# Patient Record
Sex: Female | Born: 1935 | Race: White | Hispanic: No | Smoking: Never smoker
Health system: Southern US, Community
[De-identification: ages and names within clinical notes are randomized; demographics above are authoritative.]

## PROBLEM LIST (undated history)

## (undated) DIAGNOSIS — I10 Essential (primary) hypertension: Secondary | ICD-10-CM

## (undated) DIAGNOSIS — E7800 Pure hypercholesterolemia, unspecified: Secondary | ICD-10-CM

## (undated) DIAGNOSIS — E079 Disorder of thyroid, unspecified: Secondary | ICD-10-CM

## (undated) HISTORY — PX: TUBAL LIGATION: SHX77

## (undated) HISTORY — PX: CHOLECYSTECTOMY: SHX55

## (undated) HISTORY — PX: HEMORRHOID SURGERY: SHX153

---

## 1999-09-19 ENCOUNTER — Emergency Department (HOSPITAL_COMMUNITY): Admission: EM | Admit: 1999-09-19 | Discharge: 1999-09-19 | Payer: Self-pay | Admitting: Emergency Medicine

## 1999-09-19 ENCOUNTER — Encounter: Payer: Self-pay | Admitting: Emergency Medicine

## 1999-10-03 ENCOUNTER — Encounter: Payer: Self-pay | Admitting: General Surgery

## 1999-10-07 ENCOUNTER — Ambulatory Visit (HOSPITAL_COMMUNITY): Admission: RE | Admit: 1999-10-07 | Discharge: 1999-10-08 | Payer: Self-pay | Admitting: General Surgery

## 2000-03-16 ENCOUNTER — Encounter: Payer: Self-pay | Admitting: Family Medicine

## 2000-03-16 ENCOUNTER — Encounter: Admission: RE | Admit: 2000-03-16 | Discharge: 2000-03-16 | Payer: Self-pay | Admitting: Family Medicine

## 2017-02-23 ENCOUNTER — Other Ambulatory Visit: Payer: Self-pay | Admitting: Physician Assistant

## 2017-02-23 ENCOUNTER — Ambulatory Visit
Admission: RE | Admit: 2017-02-23 | Discharge: 2017-02-23 | Disposition: A | Discharge status: Home / Self Care | Payer: 59 | Source: Ambulatory Visit | Attending: Physician Assistant | Admitting: Physician Assistant

## 2017-02-23 DIAGNOSIS — M25531 Pain in right wrist: Secondary | ICD-10-CM

## 2017-09-06 ENCOUNTER — Ambulatory Visit
Admission: RE | Admit: 2017-09-06 | Discharge: 2017-09-06 | Disposition: A | Discharge status: Home / Self Care | Payer: Medicare Other | Source: Ambulatory Visit | Attending: Physician Assistant | Admitting: Physician Assistant

## 2017-09-06 ENCOUNTER — Other Ambulatory Visit: Payer: Self-pay | Admitting: Physician Assistant

## 2017-09-06 DIAGNOSIS — R062 Wheezing: Secondary | ICD-10-CM

## 2017-09-06 DIAGNOSIS — R059 Cough, unspecified: Secondary | ICD-10-CM

## 2017-09-06 DIAGNOSIS — R05 Cough: Secondary | ICD-10-CM

## 2021-08-24 ENCOUNTER — Emergency Department (HOSPITAL_BASED_OUTPATIENT_CLINIC_OR_DEPARTMENT_OTHER)
Admission: EM | Admit: 2021-08-24 | Discharge: 2021-08-25 | Disposition: A | Discharge status: Home / Self Care | Payer: Medicare Other | Attending: Emergency Medicine | Admitting: Emergency Medicine

## 2021-08-24 ENCOUNTER — Emergency Department (HOSPITAL_BASED_OUTPATIENT_CLINIC_OR_DEPARTMENT_OTHER): Payer: Medicare Other

## 2021-08-24 ENCOUNTER — Encounter (HOSPITAL_BASED_OUTPATIENT_CLINIC_OR_DEPARTMENT_OTHER): Payer: Self-pay | Admitting: Emergency Medicine

## 2021-08-24 ENCOUNTER — Other Ambulatory Visit: Payer: Self-pay

## 2021-08-24 DIAGNOSIS — D649 Anemia, unspecified: Secondary | ICD-10-CM | POA: Insufficient documentation

## 2021-08-24 DIAGNOSIS — R7989 Other specified abnormal findings of blood chemistry: Secondary | ICD-10-CM | POA: Insufficient documentation

## 2021-08-24 DIAGNOSIS — R600 Localized edema: Secondary | ICD-10-CM | POA: Insufficient documentation

## 2021-08-24 DIAGNOSIS — R030 Elevated blood-pressure reading, without diagnosis of hypertension: Secondary | ICD-10-CM

## 2021-08-24 DIAGNOSIS — Z79899 Other long term (current) drug therapy: Secondary | ICD-10-CM | POA: Diagnosis not present

## 2021-08-24 DIAGNOSIS — R001 Bradycardia, unspecified: Secondary | ICD-10-CM | POA: Insufficient documentation

## 2021-08-24 DIAGNOSIS — I10 Essential (primary) hypertension: Secondary | ICD-10-CM | POA: Insufficient documentation

## 2021-08-24 DIAGNOSIS — R609 Edema, unspecified: Secondary | ICD-10-CM

## 2021-08-24 HISTORY — DX: Disorder of thyroid, unspecified: E07.9

## 2021-08-24 HISTORY — DX: Pure hypercholesterolemia, unspecified: E78.00

## 2021-08-24 HISTORY — DX: Essential (primary) hypertension: I10

## 2021-08-24 LAB — CBC WITH DIFFERENTIAL/PLATELET
Abs Immature Granulocytes: 0.03 10*3/uL (ref 0.00–0.07)
Basophils Absolute: 0 10*3/uL (ref 0.0–0.1)
Basophils Relative: 1 %
Eosinophils Absolute: 0.1 10*3/uL (ref 0.0–0.5)
Eosinophils Relative: 2 %
HCT: 33.4 % — ABNORMAL LOW (ref 36.0–46.0)
Hemoglobin: 11.1 g/dL — ABNORMAL LOW (ref 12.0–15.0)
Immature Granulocytes: 0 %
Lymphocytes Relative: 21 %
Lymphs Abs: 1.6 10*3/uL (ref 0.7–4.0)
MCH: 28.8 pg (ref 26.0–34.0)
MCHC: 33.2 g/dL (ref 30.0–36.0)
MCV: 86.5 fL (ref 80.0–100.0)
Monocytes Absolute: 0.6 10*3/uL (ref 0.1–1.0)
Monocytes Relative: 8 %
Neutro Abs: 5.1 10*3/uL (ref 1.7–7.7)
Neutrophils Relative %: 68 %
Platelets: 206 10*3/uL (ref 150–400)
RBC: 3.86 MIL/uL — ABNORMAL LOW (ref 3.87–5.11)
RDW: 13.2 % (ref 11.5–15.5)
WBC: 7.5 10*3/uL (ref 4.0–10.5)
nRBC: 0 % (ref 0.0–0.2)

## 2021-08-24 LAB — COMPREHENSIVE METABOLIC PANEL
ALT: 13 U/L (ref 0–44)
AST: 19 U/L (ref 15–41)
Albumin: 3.8 g/dL (ref 3.5–5.0)
Alkaline Phosphatase: 54 U/L (ref 38–126)
Anion gap: 8 (ref 5–15)
BUN: 26 mg/dL — ABNORMAL HIGH (ref 8–23)
CO2: 26 mmol/L (ref 22–32)
Calcium: 9.3 mg/dL (ref 8.9–10.3)
Chloride: 97 mmol/L — ABNORMAL LOW (ref 98–111)
Creatinine, Ser: 0.99 mg/dL (ref 0.44–1.00)
GFR, Estimated: 53 mL/min — ABNORMAL LOW (ref >60)
Glucose, Bld: 112 mg/dL — ABNORMAL HIGH (ref 70–99)
Potassium: 3.9 mmol/L (ref 3.5–5.1)
Sodium: 131 mmol/L — ABNORMAL LOW (ref 135–145)
Total Bilirubin: 0.6 mg/dL (ref 0.3–1.2)
Total Protein: 7.1 g/dL (ref 6.5–8.1)

## 2021-08-24 LAB — BRAIN NATRIURETIC PEPTIDE: B Natriuretic Peptide: 120.2 pg/mL — ABNORMAL HIGH (ref 0.0–100.0)

## 2021-08-24 MED ORDER — HYDRALAZINE HCL 10 MG PO TABS
10.0000 mg | ORAL_TABLET | Freq: Once | ORAL | Status: AC
  Administered 2021-08-24: 10 mg via ORAL
  Filled 2021-08-24: qty 1

## 2021-08-24 NOTE — ED Triage Notes (Signed)
States she checked her BP at home at 190/80. She takes blood pressure medication. Denies chest pain, headache, SOB ?

## 2021-08-24 NOTE — ED Provider Notes (Signed)
?[facility] EMERGENCY DEPARTMENT ?Provider Note ? ? ?CSN: [id number] ?Arrival date & time: 08/24/21  2046 ? ?  ? ?History ? ?Chief Complaint  ?Patient presents with  ? Hypertension  ? ? ?Erika Nguyen is a 88 y.o. female. ? ?The history is provided by the patient and a relative. No language interpreter was used.  ?Hypertension ?This is a chronic problem. The problem occurs constantly. The problem has been rapidly worsening. Pertinent negatives include no chest pain, no abdominal pain, no headaches and no shortness of breath. Nothing aggravates the symptoms. Nothing relieves the symptoms. She has tried nothing for the symptoms. The treatment provided no relief.  ? ?  ? ?Home Medications ?Prior to Admission medications   ?Not on File  ?   ? ?Allergies    ?Patient has no known allergies.   ? ?Review of Systems   ?Review of Systems  ?Constitutional:  Negative for chills, diaphoresis, fatigue and fever.  ?HENT:  Negative for congestion.   ?Eyes:  Negative for visual disturbance.  ?Respiratory:  Negative for shortness of breath.   ?Cardiovascular:  Positive for leg swelling. Negative for chest pain and palpitations.  ?Gastrointestinal:  Negative for abdominal pain, constipation, diarrhea, nausea and vomiting.  ?Genitourinary:  Negative for dysuria, flank pain and frequency.  ?Musculoskeletal:  Negative for back pain, neck pain and neck stiffness.  ?Skin:  Negative for rash and wound.  ?Neurological:  Negative for dizziness, weakness, light-headedness, numbness and headaches.  ?Psychiatric/Behavioral:  Negative for agitation and confusion.   ? ?Physical Exam ?Updated Vital Signs ?BP (!) 183/68 (BP Location: Right Arm)   Pulse 74   Temp 98.5 ?F (36.9 ?C) (Oral)   Resp 16   Ht 4\' 11"  (1.499 m)   Wt 52.2 kg   SpO2 100%   BMI 23.23 kg/m?  ?Physical Exam ?Vitals and nursing note reviewed.  ?Constitutional:   ?   General: She is not in acute distress. ?   Appearance: She is well-developed. She is not ill-appearing,  toxic-appearing or diaphoretic.  ?HENT:  ?   Head: Normocephalic and atraumatic.  ?   Nose: No congestion or rhinorrhea.  ?   Mouth/Throat:  ?   Mouth: Mucous membranes are moist.  ?   Pharynx: No oropharyngeal exudate or posterior oropharyngeal erythema.  ?Eyes:  ?   Extraocular Movements: Extraocular movements intact.  ?   Conjunctiva/sclera: Conjunctivae normal.  ?   Pupils: Pupils are equal, round, and reactive to light.  ?Cardiovascular:  ?   Rate and Rhythm: Normal rate and regular rhythm.  ?   Pulses: Normal pulses.  ?   Heart sounds: No murmur heard. ?Pulmonary:  ?   Effort: Pulmonary effort is normal. No respiratory distress.  ?   Breath sounds: Normal breath sounds. No wheezing, rhonchi or rales.  ?Chest:  ?   Chest wall: No tenderness.  ?Abdominal:  ?   General: Abdomen is flat.  ?   Palpations: Abdomen is soft.  ?   Tenderness: There is no abdominal tenderness. There is no right CVA tenderness, left CVA tenderness, guarding or rebound.  ?Musculoskeletal:     ?   General: No swelling or tenderness.  ?   Cervical back: Neck supple. No tenderness.  ?   Right lower leg: Edema present.  ?   Left lower leg: Edema present.  ?Skin: ?   General: Skin is warm and dry.  ?   Capillary Refill: Capillary refill takes less than 2 seconds.  ?  Findings: No erythema or rash.  ?Neurological:  ?   General: No focal deficit present.  ?   Mental Status: She is alert.  ?   Sensory: No sensory deficit.  ?   Motor: No weakness.  ?Psychiatric:     ?   Mood and Affect: Mood normal.  ? ? ?ED Results / Procedures / Treatments   ?Labs ?(all labs ordered are listed, but only abnormal results are displayed) ?Labs Reviewed  ?CBC WITH DIFFERENTIAL/PLATELET - Abnormal; Notable for the following components:  ?    Result Value  ? RBC 3.86 (*)   ? Hemoglobin 11.1 (*)   ? HCT 33.4 (*)   ? All other components within normal limits  ?COMPREHENSIVE METABOLIC PANEL - Abnormal; Notable for the following components:  ? Sodium 131 (*)   ? Chloride  97 (*)   ? Glucose, Bld 112 (*)   ? BUN 26 (*)   ? GFR, Estimated 53 (*)   ? All other components within normal limits  ?BRAIN NATRIURETIC PEPTIDE - Abnormal; Notable for the following components:  ? B Natriuretic Peptide 120.2 (*)   ? All other components within normal limits  ?URINE CULTURE  ?URINALYSIS, ROUTINE W REFLEX MICROSCOPIC  ?TSH  ? ? ?EKG ?EKG Interpretation ? ?Date/Time:  Sunday August 24 2021 22:25:58 EDT ?Ventricular Rate:  65 ?PR Interval:  155 ?QRS Duration: 102 ?QT Interval:  409 ?QTC Calculation: 426 ?R Axis:   14 ?Text Interpretation: Sinus rhythm Abnormal R-wave progression, early transition when compared to prior, similar appearance. No STEMI Confirmed by Theda Belfastegeler, Chris ([id number]) on 08/24/2021 10:49:09 PM ? ?Radiology ?DG Chest 2 View ? ?Result Date: 08/24/2021 ?CLINICAL DATA:  Peripheral edema. EXAM: CHEST - 2 VIEW COMPARISON:  September 06, 2017 FINDINGS: Chronic appearing diffusely increased interstitial lung markings are seen, without evidence of acute infiltrate, pleural effusion or pneumothorax. The heart size and mediastinal contours are within normal limits. There is marked severity calcification and tortuosity of the thoracic aorta. Degenerative changes seen throughout the thoracic spine. IMPRESSION: Chronic appearing increased interstitial lung markings without evidence of acute or active cardiopulmonary disease. Electronically Signed   By: Aram Candelahaddeus  Houston M.D.   On: 08/24/2021 22:26   ? ?Procedures ?Procedures  ? ? ?Medications Ordered in ED ?Medications  ?hydrALAZINE (APRESOLINE) tablet 10 mg (10 mg Oral Given 08/24/21 2212)  ? ? ?ED Course/ Medical Decision Making/ A&P ?  ?                        ?Medical Decision Making ?Amount and/or Complexity of Data Reviewed ?Labs: ordered. ?Radiology: ordered. ? ?Risk ?Prescription drug management. ? ? ? ?Erika Nguyen is a 88 y.o. female with a past medical history significant for hypertension, hypercholesterolemia, thyroid disease, and previous  cholecystectomy who presents with elevated blood pressures and worsening peripheral edema.  According to family, patient has been doing well and avoiding salt and taking her medications however today noticed blood pressures were going up into the 170s, 180s, and 190s systolic.  Patient has not had any acute symptoms however with no chest pain, shortness of breath, fatigue, lightheadedness, chest pain, shortness of breath, palpitations or syncope.  Family does report of left several days they have noticed more edema in both of her legs but she is not having any other peripheral edema.  She denies any recent infection symptoms with no urinary changes, constipation, diarrhea, or significant cough.  She is otherwise been feeling at her baseline.  No focal neurologic deficits reported..  Family was concerned due to the blood pressure being elevated and the edema they noticed on her when they were assessing her today. ? ?On exam, lungs are clear and chest is nontender.  Abdomen is nontender.  No focal neurologic deficits.  Patient is pleasant and has no complaints.  Patient does have some pitting edema in both legs near the ankles but otherwise has intact sensation and strength and pulses in extremities.  No focal neurologic deficits.  Patient well-appearing.  On my initial exam, blood pressure was in the 190s. ? ?Family reports her blood pressure is normally in the 140s for the patient that this is elevated for her.  Due to the edema being new, it is reasonable to get some screening blood work to look for new kidney dysfunction, new heart failure, or other acute abnormalities.  We will also look for occult infection leading to the labile blood pressure and get urinalysis, chest x-ray, and labs. ? ?If work-up is reassuring, anticipate discharge home to speak to her PCP tomorrow to discuss blood pressure medication titration.  Due to the elevation into the 190s, we will give 1 dose of oral hydralazine to try and lower it  slightly however we will hold on beta-blockers as her heart rate is borderline bradycardic at this time. ? ?Anticipate discharge with PCP follow-up if work-up is reassuring. ? ?Work-up began to return.  BNP slight

## 2021-08-25 LAB — URINALYSIS, MICROSCOPIC (REFLEX)

## 2021-08-25 LAB — URINALYSIS, ROUTINE W REFLEX MICROSCOPIC
Bilirubin Urine: NEGATIVE
Glucose, UA: NEGATIVE mg/dL
Ketones, ur: NEGATIVE mg/dL
Nitrite: NEGATIVE
Protein, ur: NEGATIVE mg/dL
Specific Gravity, Urine: 1.01 (ref 1.005–1.030)
pH: 6 (ref 5.0–8.0)

## 2021-08-25 LAB — TSH: TSH: 1.34 u[IU]/mL (ref 0.350–4.500)

## 2021-08-25 MED ORDER — FOSFOMYCIN TROMETHAMINE 3 G PO PACK
3.0000 g | PACK | Freq: Once | ORAL | Status: AC
  Administered 2021-08-25: 3 g via ORAL
  Filled 2021-08-25: qty 3

## 2021-08-25 NOTE — ED Provider Notes (Signed)
Nursing notes and vitals signs, including pulse oximetry, reviewed. ? ?Summary of this visit's results, reviewed by myself: ? ?EKG: ? EKG Interpretation ? ?Date/Time:  Sunday August 24 2021 22:25:58 EDT ?Ventricular Rate:  65 ?PR Interval:  155 ?QRS Duration: 102 ?QT Interval:  409 ?QTC Calculation: 426 ?R Axis:   14 ?Text Interpretation: Sinus rhythm Abnormal R-wave progression, early transition when compared to prior, similar appearance. No STEMI Confirmed by Antony Blackbird [phone]) on 08/24/2021 10:49:09 PM ?  ? ?  ? ? ?Labs:  ?Results for orders placed or performed during the hospital encounter of 08/24/21 (from the past 24 hour(s))  ?CBC with Differential     Status: Abnormal  ? Collection Time: 08/24/21 10:20 PM  ?Result Value Ref Range  ? WBC 7.5 4.0 - 10.5 K/uL  ? RBC 3.86 (L) 3.87 - 5.11 MIL/uL  ? Hemoglobin 11.1 (L) 12.0 - 15.0 g/dL  ? HCT 33.4 (L) 36.0 - 46.0 %  ? MCV 86.5 80.0 - 100.0 fL  ? MCH 28.8 26.0 - 34.0 pg  ? MCHC 33.2 30.0 - 36.0 g/dL  ? RDW 13.2 11.5 - 15.5 %  ? Platelets 206 150 - 400 K/uL  ? nRBC 0.0 0.0 - 0.2 %  ? Neutrophils Relative % 68 %  ? Neutro Abs 5.1 1.7 - 7.7 K/uL  ? Lymphocytes Relative 21 %  ? Lymphs Abs 1.6 0.7 - 4.0 K/uL  ? Monocytes Relative 8 %  ? Monocytes Absolute 0.6 0.1 - 1.0 K/uL  ? Eosinophils Relative 2 %  ? Eosinophils Absolute 0.1 0.0 - 0.5 K/uL  ? Basophils Relative 1 %  ? Basophils Absolute 0.0 0.0 - 0.1 K/uL  ? Immature Granulocytes 0 %  ? Abs Immature Granulocytes 0.03 0.00 - 0.07 K/uL  ?Comprehensive metabolic panel     Status: Abnormal  ? Collection Time: 08/24/21 10:20 PM  ?Result Value Ref Range  ? Sodium 131 (L) 135 - 145 mmol/L  ? Potassium 3.9 3.5 - 5.1 mmol/L  ? Chloride 97 (L) 98 - 111 mmol/L  ? CO2 26 22 - 32 mmol/L  ? Glucose, Bld 112 (H) 70 - 99 mg/dL  ? BUN 26 (H) 8 - 23 mg/dL  ? Creatinine, Ser 0.99 0.44 - 1.00 mg/dL  ? Calcium 9.3 8.9 - 10.3 mg/dL  ? Total Protein 7.1 6.5 - 8.1 g/dL  ? Albumin 3.8 3.5 - 5.0 g/dL  ? AST 19 15 - 41 U/L  ? ALT 13 0 - 44  U/L  ? Alkaline Phosphatase 54 38 - 126 U/L  ? Total Bilirubin 0.6 0.3 - 1.2 mg/dL  ? GFR, Estimated 53 (L) >60 mL/min  ? Anion gap 8 5 - 15  ?Brain natriuretic peptide     Status: Abnormal  ? Collection Time: 08/24/21 10:20 PM  ?Result Value Ref Range  ? B Natriuretic Peptide 120.2 (H) 0.0 - 100.0 pg/mL  ?Urinalysis, Routine w reflex microscopic     Status: Abnormal  ? Collection Time: 08/24/21 11:49 PM  ?Result Value Ref Range  ? Color, Urine YELLOW YELLOW  ? APPearance CLEAR CLEAR  ? Specific Gravity, Urine 1.010 1.005 - 1.030  ? pH 6.0 5.0 - 8.0  ? Glucose, UA NEGATIVE NEGATIVE mg/dL  ? Hgb urine dipstick TRACE (A) NEGATIVE  ? Bilirubin Urine NEGATIVE NEGATIVE  ? Ketones, ur NEGATIVE NEGATIVE mg/dL  ? Protein, ur NEGATIVE NEGATIVE mg/dL  ? Nitrite NEGATIVE NEGATIVE  ? Leukocytes,Ua SMALL (A) NEGATIVE  ?Urinalysis, Microscopic (reflex)  Status: Abnormal  ? Collection Time: 08/24/21 11:49 PM  ?Result Value Ref Range  ? RBC / HPF 0-5 0 - 5 RBC/hpf  ? WBC, UA 11-20 0 - 5 WBC/hpf  ? Bacteria, UA RARE (A) NONE SEEN  ? Squamous Epithelial / LPF 0-5 0 - 5  ? WBC Clumps PRESENT   ? ? ?Imaging Studies: ?DG Chest 2 View ? ?Result Date: 08/24/2021 ?CLINICAL DATA:  Peripheral edema. EXAM: CHEST - 2 VIEW COMPARISON:  September 06, 2017 FINDINGS: Chronic appearing diffusely increased interstitial lung markings are seen, without evidence of acute infiltrate, pleural effusion or pneumothorax. The heart size and mediastinal contours are within normal limits. There is marked severity calcification and tortuosity of the thoracic aorta. Degenerative changes seen throughout the thoracic spine. IMPRESSION: Chronic appearing increased interstitial lung markings without evidence of acute or active cardiopulmonary disease. Electronically Signed   By: Virgina Norfolk M.D.   On: 08/24/2021 22:26   ? ?12:09 AM ?The patient's urinalysis is concerning for an early urinary tract infection.  We will treat with fosfomycin. ?  ?Shanon Rosser,  MD ?08/25/21 0009 ? ?

## 2021-08-26 LAB — URINE CULTURE: Culture: NO GROWTH

## 2023-03-16 ENCOUNTER — Other Ambulatory Visit: Payer: Self-pay

## 2023-03-16 ENCOUNTER — Emergency Department (HOSPITAL_COMMUNITY): Payer: Medicare Other

## 2023-03-16 ENCOUNTER — Observation Stay (HOSPITAL_COMMUNITY)
Admission: EM | Admit: 2023-03-16 | Discharge: 2023-03-18 | Disposition: A | Discharge status: Skilled Nursing Facility | Payer: Medicare Other | Attending: Internal Medicine | Admitting: Internal Medicine

## 2023-03-16 ENCOUNTER — Observation Stay (HOSPITAL_COMMUNITY): Payer: Medicare Other

## 2023-03-16 DIAGNOSIS — Z7982 Long term (current) use of aspirin: Secondary | ICD-10-CM | POA: Diagnosis not present

## 2023-03-16 DIAGNOSIS — S32000A Wedge compression fracture of unspecified lumbar vertebra, initial encounter for closed fracture: Secondary | ICD-10-CM | POA: Insufficient documentation

## 2023-03-16 DIAGNOSIS — Z9889 Other specified postprocedural states: Secondary | ICD-10-CM | POA: Diagnosis not present

## 2023-03-16 DIAGNOSIS — W19XXXA Unspecified fall, initial encounter: Secondary | ICD-10-CM | POA: Diagnosis not present

## 2023-03-16 DIAGNOSIS — E876 Hypokalemia: Secondary | ICD-10-CM | POA: Insufficient documentation

## 2023-03-16 DIAGNOSIS — I10 Essential (primary) hypertension: Secondary | ICD-10-CM | POA: Diagnosis not present

## 2023-03-16 DIAGNOSIS — Z79899 Other long term (current) drug therapy: Secondary | ICD-10-CM | POA: Diagnosis not present

## 2023-03-16 DIAGNOSIS — R930 Abnormal findings on diagnostic imaging of skull and head, not elsewhere classified: Secondary | ICD-10-CM

## 2023-03-16 DIAGNOSIS — R0602 Shortness of breath: Secondary | ICD-10-CM | POA: Insufficient documentation

## 2023-03-16 DIAGNOSIS — F0390 Unspecified dementia without behavioral disturbance: Secondary | ICD-10-CM | POA: Insufficient documentation

## 2023-03-16 DIAGNOSIS — R531 Weakness: Principal | ICD-10-CM | POA: Insufficient documentation

## 2023-03-16 DIAGNOSIS — E871 Hypo-osmolality and hyponatremia: Secondary | ICD-10-CM | POA: Insufficient documentation

## 2023-03-16 DIAGNOSIS — M25552 Pain in left hip: Secondary | ICD-10-CM | POA: Insufficient documentation

## 2023-03-16 DIAGNOSIS — E039 Hypothyroidism, unspecified: Secondary | ICD-10-CM | POA: Diagnosis not present

## 2023-03-16 DIAGNOSIS — R2243 Localized swelling, mass and lump, lower limb, bilateral: Secondary | ICD-10-CM | POA: Insufficient documentation

## 2023-03-16 DIAGNOSIS — W010XXA Fall on same level from slipping, tripping and stumbling without subsequent striking against object, initial encounter: Secondary | ICD-10-CM | POA: Diagnosis not present

## 2023-03-16 DIAGNOSIS — T796XXA Traumatic ischemia of muscle, initial encounter: Secondary | ICD-10-CM | POA: Insufficient documentation

## 2023-03-16 LAB — CBC WITH DIFFERENTIAL/PLATELET
Abs Immature Granulocytes: 0.05 10*3/uL (ref 0.00–0.07)
Basophils Absolute: 0 10*3/uL (ref 0.0–0.1)
Basophils Relative: 0 %
Eosinophils Absolute: 0.1 10*3/uL (ref 0.0–0.5)
Eosinophils Relative: 1 %
HCT: 33 % — ABNORMAL LOW (ref 36.0–46.0)
Hemoglobin: 10.7 g/dL — ABNORMAL LOW (ref 12.0–15.0)
Immature Granulocytes: 1 %
Lymphocytes Relative: 12 %
Lymphs Abs: 1 10*3/uL (ref 0.7–4.0)
MCH: 28.5 pg (ref 26.0–34.0)
MCHC: 32.4 g/dL (ref 30.0–36.0)
MCV: 88 fL (ref 80.0–100.0)
Monocytes Absolute: 0.8 10*3/uL (ref 0.1–1.0)
Monocytes Relative: 9 %
Neutro Abs: 6.3 10*3/uL (ref 1.7–7.7)
Neutrophils Relative %: 77 %
Platelets: 217 10*3/uL (ref 150–400)
RBC: 3.75 MIL/uL — ABNORMAL LOW (ref 3.87–5.11)
RDW: 12.7 % (ref 11.5–15.5)
WBC: 8.3 10*3/uL (ref 4.0–10.5)
nRBC: 0 % (ref 0.0–0.2)

## 2023-03-16 LAB — BASIC METABOLIC PANEL
Anion gap: 10 (ref 5–15)
BUN: 20 mg/dL (ref 8–23)
CO2: 25 mmol/L (ref 22–32)
Calcium: 9 mg/dL (ref 8.9–10.3)
Chloride: 95 mmol/L — ABNORMAL LOW (ref 98–111)
Creatinine, Ser: 0.84 mg/dL (ref 0.44–1.00)
GFR, Estimated: >60 mL/min (ref >60)
Glucose, Bld: 96 mg/dL (ref 70–99)
Potassium: 3.4 mmol/L — ABNORMAL LOW (ref 3.5–5.1)
Sodium: 130 mmol/L — ABNORMAL LOW (ref 135–145)

## 2023-03-16 LAB — CK: Total CK: 261 U/L — ABNORMAL HIGH (ref 38–234)

## 2023-03-16 MED ORDER — POTASSIUM CHLORIDE CRYS ER 20 MEQ PO TBCR
40.0000 meq | EXTENDED_RELEASE_TABLET | Freq: Once | ORAL | Status: AC
  Administered 2023-03-16: 40 meq via ORAL
  Filled 2023-03-16: qty 2

## 2023-03-16 MED ORDER — SODIUM CHLORIDE 0.9 % IV SOLN: 250.0000 mL | INTRAVENOUS | Status: AC | PRN

## 2023-03-16 MED ORDER — SERTRALINE HCL 25 MG PO TABS
25.0000 mg | ORAL_TABLET | Freq: Every evening | ORAL | Status: DC
  Administered 2023-03-16 – 2023-03-17 (×2): 25 mg via ORAL
  Filled 2023-03-16 (×2): qty 1

## 2023-03-16 MED ORDER — ASPIRIN 81 MG PO TBEC
81.0000 mg | DELAYED_RELEASE_TABLET | Freq: Every day | ORAL | Status: DC
  Administered 2023-03-16 – 2023-03-18 (×3): 81 mg via ORAL
  Filled 2023-03-16 (×3): qty 1

## 2023-03-16 MED ORDER — SODIUM CHLORIDE 0.9% FLUSH
3.0000 mL | Freq: Two times a day (BID) | INTRAVENOUS | Status: DC
  Administered 2023-03-16 – 2023-03-18 (×4): 3 mL via INTRAVENOUS

## 2023-03-16 MED ORDER — MORPHINE SULFATE (PF) 4 MG/ML IV SOLN
4.0000 mg | Freq: Once | INTRAVENOUS | Status: AC
  Administered 2023-03-16: 4 mg via INTRAVENOUS
  Filled 2023-03-16: qty 1

## 2023-03-16 MED ORDER — HYDRALAZINE HCL 10 MG PO TABS
10.0000 mg | ORAL_TABLET | Freq: Two times a day (BID) | ORAL | Status: DC
  Administered 2023-03-16 – 2023-03-17 (×2): 10 mg via ORAL
  Filled 2023-03-16 (×2): qty 1

## 2023-03-16 MED ORDER — LEVOTHYROXINE SODIUM 100 MCG PO TABS
100.0000 ug | ORAL_TABLET | Freq: Every day | ORAL | Status: DC
  Administered 2023-03-17 – 2023-03-18 (×2): 100 ug via ORAL
  Filled 2023-03-16 (×2): qty 1

## 2023-03-16 MED ORDER — HYDROCODONE-ACETAMINOPHEN 5-325 MG PO TABS
1.0000 | ORAL_TABLET | ORAL | Status: DC | PRN
  Administered 2023-03-16: 1 via ORAL
  Filled 2023-03-16: qty 1

## 2023-03-16 MED ORDER — BENAZEPRIL HCL 20 MG PO TABS
40.0000 mg | ORAL_TABLET | Freq: Every morning | ORAL | Status: DC
  Administered 2023-03-17 – 2023-03-18 (×2): 40 mg via ORAL
  Filled 2023-03-16 (×2): qty 2

## 2023-03-16 MED ORDER — ENOXAPARIN SODIUM 40 MG/0.4ML IJ SOSY
40.0000 mg | PREFILLED_SYRINGE | INTRAMUSCULAR | Status: DC
  Administered 2023-03-16 – 2023-03-17 (×2): 40 mg via SUBCUTANEOUS
  Filled 2023-03-16 (×2): qty 0.4

## 2023-03-16 MED ORDER — CARBONYL IRON 15 MG PO CHEW: 15.0000 mg | CHEWABLE_TABLET | Freq: Every day | ORAL | Status: DC

## 2023-03-16 MED ORDER — ACETAMINOPHEN 325 MG PO TABS: 650.0000 mg | ORAL_TABLET | Freq: Four times a day (QID) | ORAL | 0 refills | Status: AC | PRN

## 2023-03-16 MED ORDER — SODIUM CHLORIDE 0.9% FLUSH: 3.0000 mL | INTRAVENOUS | Status: DC | PRN

## 2023-03-16 MED ORDER — MORPHINE SULFATE (PF) 2 MG/ML IV SOLN: 1.0000 mg | INTRAVENOUS | Status: DC | PRN

## 2023-03-16 MED ORDER — PRAVASTATIN SODIUM 40 MG PO TABS
40.0000 mg | ORAL_TABLET | Freq: Every morning | ORAL | Status: DC
  Administered 2023-03-17 – 2023-03-18 (×2): 40 mg via ORAL
  Filled 2023-03-16 (×2): qty 1

## 2023-03-16 MED ORDER — VITAMIN B-12 1000 MCG PO TABS
2500.0000 ug | ORAL_TABLET | Freq: Every day | ORAL | Status: DC
  Administered 2023-03-16 – 2023-03-18 (×3): 2500 ug via ORAL
  Filled 2023-03-16 (×3): qty 3

## 2023-03-16 MED ORDER — ACETAMINOPHEN 650 MG RE SUPP: 650.0000 mg | Freq: Four times a day (QID) | RECTAL | Status: DC | PRN

## 2023-03-16 MED ORDER — ACETAMINOPHEN 325 MG PO TABS: 650.0000 mg | ORAL_TABLET | Freq: Four times a day (QID) | ORAL | Status: DC | PRN

## 2023-03-16 MED ORDER — ONDANSETRON HCL 4 MG/2ML IJ SOLN: 4.0000 mg | Freq: Four times a day (QID) | INTRAMUSCULAR | Status: DC | PRN

## 2023-03-16 MED ORDER — FERROUS SULFATE 325 (65 FE) MG PO TABS
325.0000 mg | ORAL_TABLET | Freq: Every day | ORAL | Status: DC
  Administered 2023-03-17 – 2023-03-18 (×2): 325 mg via ORAL
  Filled 2023-03-16 (×2): qty 1

## 2023-03-16 MED ORDER — OXYCODONE HCL 5 MG PO TABS: 2.5000 mg | ORAL_TABLET | Freq: Three times a day (TID) | ORAL | 0 refills | Status: DC | PRN

## 2023-03-16 MED ORDER — ONDANSETRON HCL 4 MG PO TABS: 4.0000 mg | ORAL_TABLET | Freq: Four times a day (QID) | ORAL | Status: DC | PRN

## 2023-03-16 NOTE — ED Triage Notes (Addendum)
Patient BIB EMS from home had a fall about 2300 last night. Tripped going to restroom. No LOC, did not head hit. No blood thinner.  C/o lower back pain. No deformities noted. 160/60, CBG 108, 70. Fent IV. Abrasion on left forehead.

## 2023-03-16 NOTE — H&P (Signed)
History and Physical    Erika Nguyen UYQ:[id number] DOB: 01-30-1927 DOA: 03/16/2023  I have briefly reviewed the patient's prior medical records in [facility]  PCP: Roger Kill, PA-C  Patient coming from: home  Chief Complaint: Ground-level fall  HPI: Erika Nguyen is a 89 y.o. female who lives independently, with medical history significant of hypertension, hyperlipidemia, hypothyroidism, who comes to the hospital after having a ground-level fall at home.  She fell in the early hours of 03/16/2023.  She was able to alert the family after spending several hours on the floor, and they came and picked her up and brought her to the hospital.  She denies any loss of consciousness, chest pain and reports that this was a mechanical fall as her legs gave out.  2 daughters are at bedside, and they tell me that over the last couple of weeks she has been having weakness in the left leg and they believe that is the cause for her fall.  They also report that she has been having intermittent confusion, attributing to probably mild dementia but more so in the last week, and on Saturday she told the family that patient's mother is trying to call her home.  There are no reported fever, chills, abdominal pain, nausea, vomiting, diarrhea.  Patient just received morphine on my evaluation and she is an unreliable historian, but does wake up and denies any specific complaints.  ED Course: In the emergency room she is afebrile, slightly hypertensive.  CT scan of the entire spine as well as head showed L5 superior endplate compression deformity, nondisplaced fracture through the lateral osteophyte at the inferior endplate of L4, as well as multilevel degenerative disc bulges with moderate to severe spinal canal narrowing L4-L2 5 and L5-S1.  There is also small meningioma which appears to be incidental.  She was treated conservatively in the ED, fitted with a TLSO brace and attempts were for the patient to go  back home however she was unable to ambulate due to left leg weakness as well as severe back pain at the L5 level.  Hospitalist team was asked to admit given inability to ambulate  Review of Systems: All systems reviewed, and apart from HPI, all negative  Past Medical History:  Diagnosis Date   High cholesterol    Hypertension    Thyroid disease     Past Surgical History:  Procedure Laterality Date   CHOLECYSTECTOMY     HEMORRHOID SURGERY     TUBAL LIGATION       reports that she has never smoked. She has never used smokeless tobacco. She reports that she does not drink alcohol. No history on file for drug use.  No Known Allergies  Family history reviewed and noncontributory  Prior to Admission medications   Medication Sig Start Date End Date Taking? Authorizing Provider  acetaminophen (TYLENOL) 325 MG tablet Take 2 tablets (650 mg total) by mouth every 6 (six) hours as needed. 03/16/23  Yes Tanda Rockers A, DO  aspirin EC 81 MG tablet Take 81 mg by mouth daily. 07/10/15  Yes [provider]  benazepril (LOTENSIN) 40 MG tablet Take 40 mg by mouth in the morning.   Yes [provider]  Carbonyl Iron 15 MG CHEW Chew 15 mg by mouth daily. 06/11/22  Yes [provider]  cholecalciferol (VITAMIN D3) 25 MCG (1000 UNIT) tablet Take 1,000 Units by mouth daily.   Yes [provider]  Cyanocobalamin 2500 MCG TABS Take 2,500 mcg  by mouth daily. 02/16/22  Yes [provider]  hydrALAZINE (APRESOLINE) 10 MG tablet Take 1 tablet by mouth 2 (two) times daily. 06/26/22  Yes [provider]  hydrochlorothiazide (HYDRODIURIL) 25 MG tablet Take 1 tablet by mouth in the morning. 04/21/22  Yes [provider]  levothyroxine (SYNTHROID) 100 MCG tablet Take 100 mcg by mouth daily before breakfast. 01/06/23 01/06/24 Yes [provider]  oxyCODONE (ROXICODONE) 5 MG immediate release tablet Take 0.5 tablets (2.5 mg total) by mouth every 8  (eight) hours as needed. 03/16/23  Yes Tanda Rockers A, DO  pravastatin (PRAVACHOL) 40 MG tablet Take 1 tablet by mouth in the morning. 06/04/22  Yes [provider]  sertraline (ZOLOFT) 25 MG tablet Take 1 tablet by mouth every evening. 02/20/22  Yes [provider]  valsartan (DIOVAN) 320 MG tablet Take 320 mg by mouth in the morning.   Yes [provider]  estradiol (ESTRACE) 0.1 MG/GM vaginal cream Place 1 Applicatorful vaginally See admin instructions. Use on Tuesdays and Thursdays 06/12/21   [provider]    Physical Exam: Vitals:   03/16/23 1230 03/16/23 1245 03/16/23 1451 03/16/23 1630  BP: (!) 157/55 (!) 157/49  (!) 165/59  Pulse: (!) 57 (!) 56  (!) 57  Resp:    17  Temp:   98 F (36.7 C)   TempSrc:      SpO2: 100% 100%  100%    Constitutional: NAD, calm, comfortable Eyes: PERRL, lids and conjunctivae normal ENMT: Mucous membranes are moist. Respiratory: clear to auscultation bilaterally, no wheezing, no crackles. Normal respiratory effort.  Cardiovascular: Regular rate and rhythm, no murmurs / rubs / gallops.  1+ lower extremity edema Abdomen: no tenderness, no masses palpated. Bowel sounds positive.  Musculoskeletal: no clubbing / cyanosis. Normal muscle tone.  Skin: no rashes, lesions, ulcers. No induration Neurologic: CN 2-12 grossly intact. Strength 5/5 in all 4.  Psychiatric: Alert to place and situation  Labs on Admission: I have personally reviewed following labs and imaging studies  CBC: Recent Labs  Lab 03/16/23 1113  WBC 8.3  NEUTROABS 6.3  HGB 10.7*  HCT 33.0*  MCV 88.0  PLT 217   Basic Metabolic Panel: Recent Labs  Lab 03/16/23 1113  NA 130*  K 3.4*  CL 95*  CO2 25  GLUCOSE 96  BUN 20  CREATININE 0.84  CALCIUM 9.0   Liver Function Tests: No results for input(s): "AST", "ALT", "ALKPHOS", "BILITOT", "PROT", "ALBUMIN" in the last 168 hours. Coagulation Profile: No results for input(s): "INR", "PROTIME" in  the last 168 hours. BNP (last 3 results) No results for input(s): "PROBNP" in the last 8760 hours. CBG: No results for input(s): "GLUCAP" in the last 168 hours. Thyroid Function Tests: No results for input(s): "TSH", "T4TOTAL", "FREET4", "T3FREE", "THYROIDAB" in the last 72 hours. Urine analysis:    Component Value Date/Time   COLORURINE YELLOW 08/24/2021 2349   APPEARANCEUR CLEAR 08/24/2021 2349   LABSPEC 1.010 08/24/2021 2349   PHURINE 6.0 08/24/2021 2349   GLUCOSEU NEGATIVE 08/24/2021 2349   HGBUR TRACE (A) 08/24/2021 2349   BILIRUBINUR NEGATIVE 08/24/2021 2349   KETONESUR NEGATIVE 08/24/2021 2349   PROTEINUR NEGATIVE 08/24/2021 2349   NITRITE NEGATIVE 08/24/2021 2349   LEUKOCYTESUR SMALL (A) 08/24/2021 2349     Radiological Exams on Admission: CT Head Wo Contrast  Result Date: 03/16/2023 CLINICAL DATA:  Head trauma, minor (Age >= 65y); Low back pain, trauma; Neck trauma (Age >= 65y); Mid-back pain EXAM: CT HEAD  WITHOUT CONTRAST CT CERVICAL SPINE WITHOUT CONTRAST CT THORACIC SPINE WITHOUT CONTRAST CT LUMBAR SPINE WITHOUT CONTRAST TECHNIQUE: Multidetector CT imaging of the head, cervical spine, thoracic spine, lumbar spine was performed following the standard protocol without intravenous contrast. Multiplanar CT image reconstructions of the cervical spine were also generated. RADIATION DOSE REDUCTION: This exam was performed according to the departmental dose-optimization program which includes automated exposure control, adjustment of the mA and/or kV according to patient size and/or use of iterative reconstruction technique. COMPARISON:  None Available. FINDINGS: CT HEAD FINDINGS Brain: No hemorrhage. No hydrocephalus. No extra-axial fluid collection. No CT evidence of an acute cortical infarct. No mass effect. Generalized volume loss without lobar predominance. There is a 0.5 x 1.1 cm extra-axial lesion along the right sigmoid sinus. This is favored to represent a meningioma.  Vascular: No hyperdense vessel or unexpected calcification. Skull: Normal. Negative for fracture or focal lesion. Sinuses/Orbits: No middle ear or mastoid effusion. Paranasal sinuses are notable for mild mucosal thickening of the floor of bilateral maxillary sinuses. Bilateral lens replacement. Orbits are otherwise unremarkable. Other: None. CT CERVICAL SPINE FINDINGS Alignment: Grade 1 anterolisthesis of C2 on C3. Trace retrolisthesis of C3 on C4. Skull base and vertebrae: No acute fracture. No primary bone lesion or focal pathologic process. Soft tissues and spinal canal: No prevertebral fluid or swelling. No visible canal hematoma. Disc levels: No evidence of high-grade spinal canal stenosis Upper chest: There is a 3 mm solid pulmonary nodule in the right upper lobe (series 5, image 83). Aortic atherosclerotic calcifications. CT THORACIC SPINE FINDINGS Alignment: Rightward curvature of the lumbar spine Vertebrae: No acute fracture or focal pathologic process. Paraspinal and other soft tissues: 3 mm solid pulmonary nodule in the peripheral aspect of the right middle lobe. Coronary artery calcifications. Aortic atherosclerotic calcifications. Status post cholecystectomy Disc levels: No evidence of high-grade spinal canal stenosis. CT LUMBAR SPINE FINDINGS Segmentation: Transitional anatomy with lumbarization of S1. Last well-formed disc space is labeled S1-S2. Alignment: There is rightward curvature of the lumbar spine. Vertebrae: There is an acute superior endplate compression deformity at L5. There is also a nondisplaced fracture through the lateral osteophyte at the inferior endplate of L4 (series 9, image 52) Paraspinal and other soft tissues: There is soft tissue stranding along the left lateral aspect of the L4 vertebral body, favored to be posttraumatic. Disc levels: There are multilevel degenerative disc bulges that results in moderate to severe spinal canal narrowing at L4-L5 and L5-S1. IMPRESSION: 1. No  acute intracranial abnormality. 2. No acute fracture or traumatic listhesis of the cervical, thoracic, or lumbar spine. 3. Acute superior endplate compression deformity at L5. 4. Nondisplaced fracture through the lateral osteophyte at the inferior endplate of L4. 5. Multilevel degenerative disc bulges that results in moderate to severe spinal canal narrowing at L4-L5 and L5-S1. 6. There is a 0.5 x 1.1 cm extra-axial lesion along the right sigmoid sinus. This is favored to represent a meningioma. Recommend comparison prior imaging. Aortic Atherosclerosis (ICD10-I70.0). Electronically Signed   By: Lorenza Cambridge M.D.   On: 03/16/2023 15:35   CT Cervical Spine Wo Contrast  Result Date: 03/16/2023 CLINICAL DATA:  Head trauma, minor (Age >= 65y); Low back pain, trauma; Neck trauma (Age >= 65y); Mid-back pain EXAM: CT HEAD WITHOUT CONTRAST CT CERVICAL SPINE WITHOUT CONTRAST CT THORACIC SPINE WITHOUT CONTRAST CT LUMBAR SPINE WITHOUT CONTRAST TECHNIQUE: Multidetector CT imaging of the head, cervical spine, thoracic spine, lumbar spine was performed following the standard protocol without intravenous contrast. Multiplanar  CT image reconstructions of the cervical spine were also generated. RADIATION DOSE REDUCTION: This exam was performed according to the departmental dose-optimization program which includes automated exposure control, adjustment of the mA and/or kV according to patient size and/or use of iterative reconstruction technique. COMPARISON:  None Available. FINDINGS: CT HEAD FINDINGS Brain: No hemorrhage. No hydrocephalus. No extra-axial fluid collection. No CT evidence of an acute cortical infarct. No mass effect. Generalized volume loss without lobar predominance. There is a 0.5 x 1.1 cm extra-axial lesion along the right sigmoid sinus. This is favored to represent a meningioma. Vascular: No hyperdense vessel or unexpected calcification. Skull: Normal. Negative for fracture or focal lesion. Sinuses/Orbits:  No middle ear or mastoid effusion. Paranasal sinuses are notable for mild mucosal thickening of the floor of bilateral maxillary sinuses. Bilateral lens replacement. Orbits are otherwise unremarkable. Other: None. CT CERVICAL SPINE FINDINGS Alignment: Grade 1 anterolisthesis of C2 on C3. Trace retrolisthesis of C3 on C4. Skull base and vertebrae: No acute fracture. No primary bone lesion or focal pathologic process. Soft tissues and spinal canal: No prevertebral fluid or swelling. No visible canal hematoma. Disc levels: No evidence of high-grade spinal canal stenosis Upper chest: There is a 3 mm solid pulmonary nodule in the right upper lobe (series 5, image 83). Aortic atherosclerotic calcifications. CT THORACIC SPINE FINDINGS Alignment: Rightward curvature of the lumbar spine Vertebrae: No acute fracture or focal pathologic process. Paraspinal and other soft tissues: 3 mm solid pulmonary nodule in the peripheral aspect of the right middle lobe. Coronary artery calcifications. Aortic atherosclerotic calcifications. Status post cholecystectomy Disc levels: No evidence of high-grade spinal canal stenosis. CT LUMBAR SPINE FINDINGS Segmentation: Transitional anatomy with lumbarization of S1. Last well-formed disc space is labeled S1-S2. Alignment: There is rightward curvature of the lumbar spine. Vertebrae: There is an acute superior endplate compression deformity at L5. There is also a nondisplaced fracture through the lateral osteophyte at the inferior endplate of L4 (series 9, image 52) Paraspinal and other soft tissues: There is soft tissue stranding along the left lateral aspect of the L4 vertebral body, favored to be posttraumatic. Disc levels: There are multilevel degenerative disc bulges that results in moderate to severe spinal canal narrowing at L4-L5 and L5-S1. IMPRESSION: 1. No acute intracranial abnormality. 2. No acute fracture or traumatic listhesis of the cervical, thoracic, or lumbar spine. 3. Acute  superior endplate compression deformity at L5. 4. Nondisplaced fracture through the lateral osteophyte at the inferior endplate of L4. 5. Multilevel degenerative disc bulges that results in moderate to severe spinal canal narrowing at L4-L5 and L5-S1. 6. There is a 0.5 x 1.1 cm extra-axial lesion along the right sigmoid sinus. This is favored to represent a meningioma. Recommend comparison prior imaging. Aortic Atherosclerosis (ICD10-I70.0). Electronically Signed   By: Lorenza Cambridge M.D.   On: 03/16/2023 15:35   CT Lumbar Spine Wo Contrast  Result Date: 03/16/2023 CLINICAL DATA:  Head trauma, minor (Age >= 65y); Low back pain, trauma; Neck trauma (Age >= 65y); Mid-back pain EXAM: CT HEAD WITHOUT CONTRAST CT CERVICAL SPINE WITHOUT CONTRAST CT THORACIC SPINE WITHOUT CONTRAST CT LUMBAR SPINE WITHOUT CONTRAST TECHNIQUE: Multidetector CT imaging of the head, cervical spine, thoracic spine, lumbar spine was performed following the standard protocol without intravenous contrast. Multiplanar CT image reconstructions of the cervical spine were also generated. RADIATION DOSE REDUCTION: This exam was performed according to the departmental dose-optimization program which includes automated exposure control, adjustment of the mA and/or kV according to patient size and/or use  of iterative reconstruction technique. COMPARISON:  None Available. FINDINGS: CT HEAD FINDINGS Brain: No hemorrhage. No hydrocephalus. No extra-axial fluid collection. No CT evidence of an acute cortical infarct. No mass effect. Generalized volume loss without lobar predominance. There is a 0.5 x 1.1 cm extra-axial lesion along the right sigmoid sinus. This is favored to represent a meningioma. Vascular: No hyperdense vessel or unexpected calcification. Skull: Normal. Negative for fracture or focal lesion. Sinuses/Orbits: No middle ear or mastoid effusion. Paranasal sinuses are notable for mild mucosal thickening of the floor of bilateral maxillary  sinuses. Bilateral lens replacement. Orbits are otherwise unremarkable. Other: None. CT CERVICAL SPINE FINDINGS Alignment: Grade 1 anterolisthesis of C2 on C3. Trace retrolisthesis of C3 on C4. Skull base and vertebrae: No acute fracture. No primary bone lesion or focal pathologic process. Soft tissues and spinal canal: No prevertebral fluid or swelling. No visible canal hematoma. Disc levels: No evidence of high-grade spinal canal stenosis Upper chest: There is a 3 mm solid pulmonary nodule in the right upper lobe (series 5, image 83). Aortic atherosclerotic calcifications. CT THORACIC SPINE FINDINGS Alignment: Rightward curvature of the lumbar spine Vertebrae: No acute fracture or focal pathologic process. Paraspinal and other soft tissues: 3 mm solid pulmonary nodule in the peripheral aspect of the right middle lobe. Coronary artery calcifications. Aortic atherosclerotic calcifications. Status post cholecystectomy Disc levels: No evidence of high-grade spinal canal stenosis. CT LUMBAR SPINE FINDINGS Segmentation: Transitional anatomy with lumbarization of S1. Last well-formed disc space is labeled S1-S2. Alignment: There is rightward curvature of the lumbar spine. Vertebrae: There is an acute superior endplate compression deformity at L5. There is also a nondisplaced fracture through the lateral osteophyte at the inferior endplate of L4 (series 9, image 52) Paraspinal and other soft tissues: There is soft tissue stranding along the left lateral aspect of the L4 vertebral body, favored to be posttraumatic. Disc levels: There are multilevel degenerative disc bulges that results in moderate to severe spinal canal narrowing at L4-L5 and L5-S1. IMPRESSION: 1. No acute intracranial abnormality. 2. No acute fracture or traumatic listhesis of the cervical, thoracic, or lumbar spine. 3. Acute superior endplate compression deformity at L5. 4. Nondisplaced fracture through the lateral osteophyte at the inferior endplate of  L4. 5. Multilevel degenerative disc bulges that results in moderate to severe spinal canal narrowing at L4-L5 and L5-S1. 6. There is a 0.5 x 1.1 cm extra-axial lesion along the right sigmoid sinus. This is favored to represent a meningioma. Recommend comparison prior imaging. Aortic Atherosclerosis (ICD10-I70.0). Electronically Signed   By: Lorenza Cambridge M.D.   On: 03/16/2023 15:35   CT Thoracic Spine Wo Contrast  Result Date: 03/16/2023 CLINICAL DATA:  Head trauma, minor (Age >= 65y); Low back pain, trauma; Neck trauma (Age >= 65y); Mid-back pain EXAM: CT HEAD WITHOUT CONTRAST CT CERVICAL SPINE WITHOUT CONTRAST CT THORACIC SPINE WITHOUT CONTRAST CT LUMBAR SPINE WITHOUT CONTRAST TECHNIQUE: Multidetector CT imaging of the head, cervical spine, thoracic spine, lumbar spine was performed following the standard protocol without intravenous contrast. Multiplanar CT image reconstructions of the cervical spine were also generated. RADIATION DOSE REDUCTION: This exam was performed according to the departmental dose-optimization program which includes automated exposure control, adjustment of the mA and/or kV according to patient size and/or use of iterative reconstruction technique. COMPARISON:  None Available. FINDINGS: CT HEAD FINDINGS Brain: No hemorrhage. No hydrocephalus. No extra-axial fluid collection. No CT evidence of an acute cortical infarct. No mass effect. Generalized volume loss without lobar predominance. There is  a 0.5 x 1.1 cm extra-axial lesion along the right sigmoid sinus. This is favored to represent a meningioma. Vascular: No hyperdense vessel or unexpected calcification. Skull: Normal. Negative for fracture or focal lesion. Sinuses/Orbits: No middle ear or mastoid effusion. Paranasal sinuses are notable for mild mucosal thickening of the floor of bilateral maxillary sinuses. Bilateral lens replacement. Orbits are otherwise unremarkable. Other: None. CT CERVICAL SPINE FINDINGS Alignment: Grade 1  anterolisthesis of C2 on C3. Trace retrolisthesis of C3 on C4. Skull base and vertebrae: No acute fracture. No primary bone lesion or focal pathologic process. Soft tissues and spinal canal: No prevertebral fluid or swelling. No visible canal hematoma. Disc levels: No evidence of high-grade spinal canal stenosis Upper chest: There is a 3 mm solid pulmonary nodule in the right upper lobe (series 5, image 83). Aortic atherosclerotic calcifications. CT THORACIC SPINE FINDINGS Alignment: Rightward curvature of the lumbar spine Vertebrae: No acute fracture or focal pathologic process. Paraspinal and other soft tissues: 3 mm solid pulmonary nodule in the peripheral aspect of the right middle lobe. Coronary artery calcifications. Aortic atherosclerotic calcifications. Status post cholecystectomy Disc levels: No evidence of high-grade spinal canal stenosis. CT LUMBAR SPINE FINDINGS Segmentation: Transitional anatomy with lumbarization of S1. Last well-formed disc space is labeled S1-S2. Alignment: There is rightward curvature of the lumbar spine. Vertebrae: There is an acute superior endplate compression deformity at L5. There is also a nondisplaced fracture through the lateral osteophyte at the inferior endplate of L4 (series 9, image 52) Paraspinal and other soft tissues: There is soft tissue stranding along the left lateral aspect of the L4 vertebral body, favored to be posttraumatic. Disc levels: There are multilevel degenerative disc bulges that results in moderate to severe spinal canal narrowing at L4-L5 and L5-S1. IMPRESSION: 1. No acute intracranial abnormality. 2. No acute fracture or traumatic listhesis of the cervical, thoracic, or lumbar spine. 3. Acute superior endplate compression deformity at L5. 4. Nondisplaced fracture through the lateral osteophyte at the inferior endplate of L4. 5. Multilevel degenerative disc bulges that results in moderate to severe spinal canal narrowing at L4-L5 and L5-S1. 6. There  is a 0.5 x 1.1 cm extra-axial lesion along the right sigmoid sinus. This is favored to represent a meningioma. Recommend comparison prior imaging. Aortic Atherosclerosis (ICD10-I70.0). Electronically Signed   By: Lorenza Cambridge M.D.   On: 03/16/2023 15:35   DG Pelvis 1-2 Views  Result Date: 03/16/2023 CLINICAL DATA:  Fall.  Lower back pain. EXAM: PELVIS - 1-2 VIEW COMPARISON:  None Available. FINDINGS: Osteopenia. There is no evidence of pelvic fracture or diastasis. The sacroiliac joints and pubic symphysis appear anatomically aligned. The hips are anatomically aligned with degenerative changes. Degenerative changes of the lumbar spine. IMPRESSION: No acute osseous abnormality on AP pelvis radiograph. Electronically Signed   By: Hart Robinsons M.D.   On: 03/16/2023 12:34   DG Chest Portable 1 View  Result Date: 03/16/2023 CLINICAL DATA:  Fall. EXAM: PORTABLE CHEST 1 VIEW COMPARISON:  Chest radiograph dated August 24, 2021. FINDINGS: The heart size and mediastinal contours are within normal limits. Aortic atherosclerosis. Chronic appearing coarse interstitial lung markings. No focal consolidation. No pneumothorax or pleural effusion. Degenerative changes of the thoracic spine and shoulders. No acute osseous abnormality. IMPRESSION: No acute findings in the chest. Electronically Signed   By: Hart Robinsons M.D.   On: 03/16/2023 12:31    EKG: Independently reviewed. Sinus rhythm   Assessment/Plan Principal problem Ground-level fall-seems mechanical in nature, she has L4 and  L5 compression deformities, fitted with a TLSO brace.  She was unable to ambulate in the ED, admitted to the hospital for pain control, PT/OT consults in the morning.  CK mildly elevated -Pain control very low-dose morphine as well as hydrocodone  Active problems Left lower extremity weakness-family reports this has been going on for the past couple weeks.  Obtain an MRI of the brain.  Continue home aspirin  Essential  hypertension-resume home medications  Hypothyroidism-continue Synthroid  Hyperlipidemia-continue home statin  Hypokalemia, hyponatremia-possibly due to HCTZ, hold for now, replace potassium  Mild dementia-they feel like Zoloft is helping, continue.  Slightly worse in the last couple of weeks, MRI as above  Chronic lower extremity swelling -hold HCTZ as above, daughter does report that her legs actually look better than her baseline currently  Left hip pain-due to fall.  Plain x-rays without acute fracture.  Will see how she does with PT tomorrow, if she is still unable to put weight on the left side with conservative management may benefit from a hip CT to rule out an occult fracture   DVT prophylaxis: Lovenox  Code Status: DNR  Family Communication: 2 daughters at bedside  Disposition Plan: home when ready Bed Type: medsurg Consults called: none  Obs/Inp: obs   Pamella Pert, MD, PhD [facility]  Contact via [email or web address]  03/16/2023, 5:13 PM

## 2023-03-16 NOTE — ED Provider Notes (Signed)
  Physical Exam  BP (!) 165/59   Pulse (!) 57   Temp 98 F (36.7 C)   Resp 17   SpO2 100%   Physical Exam  Procedures  Procedures  ED Course / MDM   Clinical Course as of 03/16/23 1656  Tue Mar 16, 2023  1529 CK Total(!): 261 Pt with prolonged downtime, her Cr is okay, tolerating PO, compartments soft  [SG]  1530 Sodium(!): 130 Similar to prior 1 yr ago [SG]  1538 CT with compression Fx L5. L4 osteophyte fx. ?meningioma [SG]    Clinical Course User Index [SG] Sloan Leiter, DO   Medical Decision Making I, Rosana Berger, assumed care for this patient.  In brief 89 year old female had a nonsyncopal fall from standing, L4 compression fracture.  T SLO brace ordered.  Patient was unable to bear weight.  Admitted to hospitalist.  Amount and/or Complexity of Data Reviewed Labs: ordered. Decision-making details documented in ED Course. Radiology: ordered.  Risk OTC drugs. Prescription drug management.          Anders Simmonds T, DO 03/16/23 1659

## 2023-03-16 NOTE — ED Provider Notes (Signed)
[place] EMERGENCY DEPARTMENT AT [facility] Provider Note  CSN: [id number] Arrival date & time: 03/16/23 1030  Chief Complaint(s) Fall  HPI Erika Nguyen is a 89 y.o. female with past medical history as below, significant for HLD, HTN, thyroid disease, prior cholecystectomy, tubal ligation who presents to the ED with complaint of fall  Fall at home last night, tripped in her bedroom while trying to get to phone, does not believe she had LOC.  No thinners. Unable to get up off the floor, tried calling for help but no answer. On the floor since around 2100 last PM.  Low / mid back pain, positive head injury to left forehead. She has no headache, chest pain, abd pain n/v. Per family weakness generalized over last few weeks, legs feeling week.    Past Medical History Past Medical History:  Diagnosis Date   High cholesterol    Hypertension    Thyroid disease    There are no problems to display for this patient.  Home Medication(s) Prior to Admission medications   Medication Sig Start Date End Date Taking? Authorizing Provider  acetaminophen (TYLENOL) 325 MG tablet Take 2 tablets (650 mg total) by mouth every 6 (six) hours as needed. 03/16/23  Yes Tanda Rockers A, DO  aspirin EC 81 MG tablet Take 81 mg by mouth daily. 07/10/15  Yes [provider]  benazepril (LOTENSIN) 40 MG tablet Take 40 mg by mouth in the morning.   Yes [provider]  Carbonyl Iron 15 MG CHEW Chew 15 mg by mouth daily. 06/11/22  Yes [provider]  cholecalciferol (VITAMIN D3) 25 MCG (1000 UNIT) tablet Take 1,000 Units by mouth daily.   Yes [provider]  Cyanocobalamin 2500 MCG TABS Take 2,500 mcg by mouth daily. 02/16/22  Yes [provider]  hydrALAZINE (APRESOLINE) 10 MG tablet Take 1 tablet by mouth 2 (two) times daily. 06/26/22  Yes [provider]  hydrochlorothiazide (HYDRODIURIL) 25 MG tablet Take 1 tablet by mouth in the morning. 04/21/22  Yes  [provider]  levothyroxine (SYNTHROID) 100 MCG tablet Take 100 mcg by mouth daily before breakfast. 01/06/23 01/06/24 Yes [provider]  oxyCODONE (ROXICODONE) 5 MG immediate release tablet Take 0.5 tablets (2.5 mg total) by mouth every 8 (eight) hours as needed. 03/16/23  Yes Tanda Rockers A, DO  pravastatin (PRAVACHOL) 40 MG tablet Take 1 tablet by mouth in the morning. 06/04/22  Yes [provider]  sertraline (ZOLOFT) 25 MG tablet Take 1 tablet by mouth every evening. 02/20/22  Yes [provider]  valsartan (DIOVAN) 320 MG tablet Take 320 mg by mouth in the morning.   Yes [provider]  estradiol (ESTRACE) 0.1 MG/GM vaginal cream Place 1 Applicatorful vaginally See admin instructions. Use on Tuesdays and Thursdays 06/12/21   [provider]  Past Surgical History Past Surgical History:  Procedure Laterality Date   CHOLECYSTECTOMY     HEMORRHOID SURGERY     TUBAL LIGATION     Family History No family history on file.  Social History Social History   Tobacco Use   Smoking status: Never   Smokeless tobacco: Never  Substance Use Topics   Alcohol use: Never   Allergies Patient has no known allergies.  Review of Systems Review of Systems  Constitutional:  Negative for fever.  Respiratory:  Negative for chest tightness and shortness of breath.   Cardiovascular:  Negative for chest pain.  Gastrointestinal:  Negative for abdominal pain and vomiting.  Genitourinary:  Negative for dysuria.  Musculoskeletal:  Positive for back pain.  Skin:  Positive for wound.  Neurological:  Negative for syncope and headaches.  Psychiatric/Behavioral:  Negative for confusion.   All other systems reviewed and are negative.   Physical Exam Vital Signs  I have reviewed the triage vital signs BP (!) 165/59    Pulse (!) 57   Temp 98 F (36.7 C)   Resp 17   SpO2 100%  Physical Exam Vitals and nursing note reviewed.  Constitutional:      General: She is not in acute distress.    Appearance: Normal appearance.  HENT:     Head: Normocephalic. Abrasion present. No raccoon eyes or Battle's sign.     Jaw: There is normal jaw occlusion.     Comments: Left frontal abrasion, nasal bridge abrasion     Right Ear: External ear normal.     Left Ear: External ear normal.     Nose: Nose normal.     Mouth/Throat:     Mouth: Mucous membranes are moist.  Eyes:     General: No scleral icterus.       Right eye: No discharge.        Left eye: No discharge.     Extraocular Movements: Extraocular movements intact.     Pupils: Pupils are equal, round, and reactive to light.  Cardiovascular:     Rate and Rhythm: Normal rate and regular rhythm.     Pulses: Normal pulses.     Heart sounds: Normal heart sounds.  Pulmonary:     Effort: Pulmonary effort is normal. No respiratory distress.     Breath sounds: Normal breath sounds. No stridor.  Abdominal:     General: Abdomen is flat. There is no distension.     Palpations: Abdomen is soft.     Tenderness: There is no abdominal tenderness.  Musculoskeletal:       Arms:     Cervical back: No rigidity.     Right lower leg: No edema.     Left lower leg: No edema.       Legs:     Comments: Pelvis stable to ap pressure  no crepitus or step-off to midline   No pain w/ logroll b/l LE   Skin:    General: Skin is warm and dry.     Capillary Refill: Capillary refill takes less than 2 seconds.  Neurological:     Mental Status: She is alert and oriented to person, place, and time.     GCS: GCS eye subscore is 4. GCS verbal subscore is 5. GCS motor subscore is 6.  Psychiatric:        Mood and Affect: Mood normal.        Behavior: Behavior normal. Behavior is cooperative.     ED Results  and Treatments Labs (all labs ordered are listed, but only abnormal  results are displayed) Labs Reviewed  CBC WITH DIFFERENTIAL/PLATELET - Abnormal; Notable for the following components:      Result Value   RBC 3.75 (*)    Hemoglobin 10.7 (*)    HCT 33.0 (*)    All other components within normal limits  BASIC METABOLIC PANEL - Abnormal; Notable for the following components:   Sodium 130 (*)    Potassium 3.4 (*)    Chloride 95 (*)    All other components within normal limits  CK - Abnormal; Notable for the following components:   Total CK 261 (*)    All other components within normal limits                                                                                                                          Radiology CT Head Wo Contrast  Result Date: 03/16/2023 CLINICAL DATA:  Head trauma, minor (Age >= 65y); Low back pain, trauma; Neck trauma (Age >= 65y); Mid-back pain EXAM: CT HEAD WITHOUT CONTRAST CT CERVICAL SPINE WITHOUT CONTRAST CT THORACIC SPINE WITHOUT CONTRAST CT LUMBAR SPINE WITHOUT CONTRAST TECHNIQUE: Multidetector CT imaging of the head, cervical spine, thoracic spine, lumbar spine was performed following the standard protocol without intravenous contrast. Multiplanar CT image reconstructions of the cervical spine were also generated. RADIATION DOSE REDUCTION: This exam was performed according to the departmental dose-optimization program which includes automated exposure control, adjustment of the mA and/or kV according to patient size and/or use of iterative reconstruction technique. COMPARISON:  None Available. FINDINGS: CT HEAD FINDINGS Brain: No hemorrhage. No hydrocephalus. No extra-axial fluid collection. No CT evidence of an acute cortical infarct. No mass effect. Generalized volume loss without lobar predominance. There is a 0.5 x 1.1 cm extra-axial lesion along the right sigmoid sinus. This is favored to represent a meningioma. Vascular: No hyperdense vessel or unexpected calcification. Skull: Normal. Negative for fracture or focal  lesion. Sinuses/Orbits: No middle ear or mastoid effusion. Paranasal sinuses are notable for mild mucosal thickening of the floor of bilateral maxillary sinuses. Bilateral lens replacement. Orbits are otherwise unremarkable. Other: None. CT CERVICAL SPINE FINDINGS Alignment: Grade 1 anterolisthesis of C2 on C3. Trace retrolisthesis of C3 on C4. Skull base and vertebrae: No acute fracture. No primary bone lesion or focal pathologic process. Soft tissues and spinal canal: No prevertebral fluid or swelling. No visible canal hematoma. Disc levels: No evidence of high-grade spinal canal stenosis Upper chest: There is a 3 mm solid pulmonary nodule in the right upper lobe (series 5, image 83). Aortic atherosclerotic calcifications. CT THORACIC SPINE FINDINGS Alignment: Rightward curvature of the lumbar spine Vertebrae: No acute fracture or focal pathologic process. Paraspinal and other soft tissues: 3 mm solid pulmonary nodule in the peripheral aspect of the right middle lobe. Coronary artery calcifications. Aortic atherosclerotic calcifications. Status post cholecystectomy Disc levels: No evidence of high-grade spinal canal stenosis. CT LUMBAR SPINE FINDINGS Segmentation:  Transitional anatomy with lumbarization of S1. Last well-formed disc space is labeled S1-S2. Alignment: There is rightward curvature of the lumbar spine. Vertebrae: There is an acute superior endplate compression deformity at L5. There is also a nondisplaced fracture through the lateral osteophyte at the inferior endplate of L4 (series 9, image 52) Paraspinal and other soft tissues: There is soft tissue stranding along the left lateral aspect of the L4 vertebral body, favored to be posttraumatic. Disc levels: There are multilevel degenerative disc bulges that results in moderate to severe spinal canal narrowing at L4-L5 and L5-S1. IMPRESSION: 1. No acute intracranial abnormality. 2. No acute fracture or traumatic listhesis of the cervical, thoracic, or  lumbar spine. 3. Acute superior endplate compression deformity at L5. 4. Nondisplaced fracture through the lateral osteophyte at the inferior endplate of L4. 5. Multilevel degenerative disc bulges that results in moderate to severe spinal canal narrowing at L4-L5 and L5-S1. 6. There is a 0.5 x 1.1 cm extra-axial lesion along the right sigmoid sinus. This is favored to represent a meningioma. Recommend comparison prior imaging. Aortic Atherosclerosis (ICD10-I70.0). Electronically Signed   By: Lorenza Cambridge M.D.   On: 03/16/2023 15:35   CT Cervical Spine Wo Contrast  Result Date: 03/16/2023 CLINICAL DATA:  Head trauma, minor (Age >= 65y); Low back pain, trauma; Neck trauma (Age >= 65y); Mid-back pain EXAM: CT HEAD WITHOUT CONTRAST CT CERVICAL SPINE WITHOUT CONTRAST CT THORACIC SPINE WITHOUT CONTRAST CT LUMBAR SPINE WITHOUT CONTRAST TECHNIQUE: Multidetector CT imaging of the head, cervical spine, thoracic spine, lumbar spine was performed following the standard protocol without intravenous contrast. Multiplanar CT image reconstructions of the cervical spine were also generated. RADIATION DOSE REDUCTION: This exam was performed according to the departmental dose-optimization program which includes automated exposure control, adjustment of the mA and/or kV according to patient size and/or use of iterative reconstruction technique. COMPARISON:  None Available. FINDINGS: CT HEAD FINDINGS Brain: No hemorrhage. No hydrocephalus. No extra-axial fluid collection. No CT evidence of an acute cortical infarct. No mass effect. Generalized volume loss without lobar predominance. There is a 0.5 x 1.1 cm extra-axial lesion along the right sigmoid sinus. This is favored to represent a meningioma. Vascular: No hyperdense vessel or unexpected calcification. Skull: Normal. Negative for fracture or focal lesion. Sinuses/Orbits: No middle ear or mastoid effusion. Paranasal sinuses are notable for mild mucosal thickening of the floor  of bilateral maxillary sinuses. Bilateral lens replacement. Orbits are otherwise unremarkable. Other: None. CT CERVICAL SPINE FINDINGS Alignment: Grade 1 anterolisthesis of C2 on C3. Trace retrolisthesis of C3 on C4. Skull base and vertebrae: No acute fracture. No primary bone lesion or focal pathologic process. Soft tissues and spinal canal: No prevertebral fluid or swelling. No visible canal hematoma. Disc levels: No evidence of high-grade spinal canal stenosis Upper chest: There is a 3 mm solid pulmonary nodule in the right upper lobe (series 5, image 83). Aortic atherosclerotic calcifications. CT THORACIC SPINE FINDINGS Alignment: Rightward curvature of the lumbar spine Vertebrae: No acute fracture or focal pathologic process. Paraspinal and other soft tissues: 3 mm solid pulmonary nodule in the peripheral aspect of the right middle lobe. Coronary artery calcifications. Aortic atherosclerotic calcifications. Status post cholecystectomy Disc levels: No evidence of high-grade spinal canal stenosis. CT LUMBAR SPINE FINDINGS Segmentation: Transitional anatomy with lumbarization of S1. Last well-formed disc space is labeled S1-S2. Alignment: There is rightward curvature of the lumbar spine. Vertebrae: There is an acute superior endplate compression deformity at L5. There is also a nondisplaced fracture through  the lateral osteophyte at the inferior endplate of L4 (series 9, image 52) Paraspinal and other soft tissues: There is soft tissue stranding along the left lateral aspect of the L4 vertebral body, favored to be posttraumatic. Disc levels: There are multilevel degenerative disc bulges that results in moderate to severe spinal canal narrowing at L4-L5 and L5-S1. IMPRESSION: 1. No acute intracranial abnormality. 2. No acute fracture or traumatic listhesis of the cervical, thoracic, or lumbar spine. 3. Acute superior endplate compression deformity at L5. 4. Nondisplaced fracture through the lateral osteophyte at  the inferior endplate of L4. 5. Multilevel degenerative disc bulges that results in moderate to severe spinal canal narrowing at L4-L5 and L5-S1. 6. There is a 0.5 x 1.1 cm extra-axial lesion along the right sigmoid sinus. This is favored to represent a meningioma. Recommend comparison prior imaging. Aortic Atherosclerosis (ICD10-I70.0). Electronically Signed   By: Lorenza Cambridge M.D.   On: 03/16/2023 15:35   CT Lumbar Spine Wo Contrast  Result Date: 03/16/2023 CLINICAL DATA:  Head trauma, minor (Age >= 65y); Low back pain, trauma; Neck trauma (Age >= 65y); Mid-back pain EXAM: CT HEAD WITHOUT CONTRAST CT CERVICAL SPINE WITHOUT CONTRAST CT THORACIC SPINE WITHOUT CONTRAST CT LUMBAR SPINE WITHOUT CONTRAST TECHNIQUE: Multidetector CT imaging of the head, cervical spine, thoracic spine, lumbar spine was performed following the standard protocol without intravenous contrast. Multiplanar CT image reconstructions of the cervical spine were also generated. RADIATION DOSE REDUCTION: This exam was performed according to the departmental dose-optimization program which includes automated exposure control, adjustment of the mA and/or kV according to patient size and/or use of iterative reconstruction technique. COMPARISON:  None Available. FINDINGS: CT HEAD FINDINGS Brain: No hemorrhage. No hydrocephalus. No extra-axial fluid collection. No CT evidence of an acute cortical infarct. No mass effect. Generalized volume loss without lobar predominance. There is a 0.5 x 1.1 cm extra-axial lesion along the right sigmoid sinus. This is favored to represent a meningioma. Vascular: No hyperdense vessel or unexpected calcification. Skull: Normal. Negative for fracture or focal lesion. Sinuses/Orbits: No middle ear or mastoid effusion. Paranasal sinuses are notable for mild mucosal thickening of the floor of bilateral maxillary sinuses. Bilateral lens replacement. Orbits are otherwise unremarkable. Other: None. CT CERVICAL SPINE  FINDINGS Alignment: Grade 1 anterolisthesis of C2 on C3. Trace retrolisthesis of C3 on C4. Skull base and vertebrae: No acute fracture. No primary bone lesion or focal pathologic process. Soft tissues and spinal canal: No prevertebral fluid or swelling. No visible canal hematoma. Disc levels: No evidence of high-grade spinal canal stenosis Upper chest: There is a 3 mm solid pulmonary nodule in the right upper lobe (series 5, image 83). Aortic atherosclerotic calcifications. CT THORACIC SPINE FINDINGS Alignment: Rightward curvature of the lumbar spine Vertebrae: No acute fracture or focal pathologic process. Paraspinal and other soft tissues: 3 mm solid pulmonary nodule in the peripheral aspect of the right middle lobe. Coronary artery calcifications. Aortic atherosclerotic calcifications. Status post cholecystectomy Disc levels: No evidence of high-grade spinal canal stenosis. CT LUMBAR SPINE FINDINGS Segmentation: Transitional anatomy with lumbarization of S1. Last well-formed disc space is labeled S1-S2. Alignment: There is rightward curvature of the lumbar spine. Vertebrae: There is an acute superior endplate compression deformity at L5. There is also a nondisplaced fracture through the lateral osteophyte at the inferior endplate of L4 (series 9, image 52) Paraspinal and other soft tissues: There is soft tissue stranding along the left lateral aspect of the L4 vertebral body, favored to be posttraumatic. Disc levels: There  are multilevel degenerative disc bulges that results in moderate to severe spinal canal narrowing at L4-L5 and L5-S1. IMPRESSION: 1. No acute intracranial abnormality. 2. No acute fracture or traumatic listhesis of the cervical, thoracic, or lumbar spine. 3. Acute superior endplate compression deformity at L5. 4. Nondisplaced fracture through the lateral osteophyte at the inferior endplate of L4. 5. Multilevel degenerative disc bulges that results in moderate to severe spinal canal narrowing at  L4-L5 and L5-S1. 6. There is a 0.5 x 1.1 cm extra-axial lesion along the right sigmoid sinus. This is favored to represent a meningioma. Recommend comparison prior imaging. Aortic Atherosclerosis (ICD10-I70.0). Electronically Signed   By: Lorenza Cambridge M.D.   On: 03/16/2023 15:35   CT Thoracic Spine Wo Contrast  Result Date: 03/16/2023 CLINICAL DATA:  Head trauma, minor (Age >= 65y); Low back pain, trauma; Neck trauma (Age >= 65y); Mid-back pain EXAM: CT HEAD WITHOUT CONTRAST CT CERVICAL SPINE WITHOUT CONTRAST CT THORACIC SPINE WITHOUT CONTRAST CT LUMBAR SPINE WITHOUT CONTRAST TECHNIQUE: Multidetector CT imaging of the head, cervical spine, thoracic spine, lumbar spine was performed following the standard protocol without intravenous contrast. Multiplanar CT image reconstructions of the cervical spine were also generated. RADIATION DOSE REDUCTION: This exam was performed according to the departmental dose-optimization program which includes automated exposure control, adjustment of the mA and/or kV according to patient size and/or use of iterative reconstruction technique. COMPARISON:  None Available. FINDINGS: CT HEAD FINDINGS Brain: No hemorrhage. No hydrocephalus. No extra-axial fluid collection. No CT evidence of an acute cortical infarct. No mass effect. Generalized volume loss without lobar predominance. There is a 0.5 x 1.1 cm extra-axial lesion along the right sigmoid sinus. This is favored to represent a meningioma. Vascular: No hyperdense vessel or unexpected calcification. Skull: Normal. Negative for fracture or focal lesion. Sinuses/Orbits: No middle ear or mastoid effusion. Paranasal sinuses are notable for mild mucosal thickening of the floor of bilateral maxillary sinuses. Bilateral lens replacement. Orbits are otherwise unremarkable. Other: None. CT CERVICAL SPINE FINDINGS Alignment: Grade 1 anterolisthesis of C2 on C3. Trace retrolisthesis of C3 on C4. Skull base and vertebrae: No acute  fracture. No primary bone lesion or focal pathologic process. Soft tissues and spinal canal: No prevertebral fluid or swelling. No visible canal hematoma. Disc levels: No evidence of high-grade spinal canal stenosis Upper chest: There is a 3 mm solid pulmonary nodule in the right upper lobe (series 5, image 83). Aortic atherosclerotic calcifications. CT THORACIC SPINE FINDINGS Alignment: Rightward curvature of the lumbar spine Vertebrae: No acute fracture or focal pathologic process. Paraspinal and other soft tissues: 3 mm solid pulmonary nodule in the peripheral aspect of the right middle lobe. Coronary artery calcifications. Aortic atherosclerotic calcifications. Status post cholecystectomy Disc levels: No evidence of high-grade spinal canal stenosis. CT LUMBAR SPINE FINDINGS Segmentation: Transitional anatomy with lumbarization of S1. Last well-formed disc space is labeled S1-S2. Alignment: There is rightward curvature of the lumbar spine. Vertebrae: There is an acute superior endplate compression deformity at L5. There is also a nondisplaced fracture through the lateral osteophyte at the inferior endplate of L4 (series 9, image 52) Paraspinal and other soft tissues: There is soft tissue stranding along the left lateral aspect of the L4 vertebral body, favored to be posttraumatic. Disc levels: There are multilevel degenerative disc bulges that results in moderate to severe spinal canal narrowing at L4-L5 and L5-S1. IMPRESSION: 1. No acute intracranial abnormality. 2. No acute fracture or traumatic listhesis of the cervical, thoracic, or lumbar spine. 3. Acute  superior endplate compression deformity at L5. 4. Nondisplaced fracture through the lateral osteophyte at the inferior endplate of L4. 5. Multilevel degenerative disc bulges that results in moderate to severe spinal canal narrowing at L4-L5 and L5-S1. 6. There is a 0.5 x 1.1 cm extra-axial lesion along the right sigmoid sinus. This is favored to represent a  meningioma. Recommend comparison prior imaging. Aortic Atherosclerosis (ICD10-I70.0). Electronically Signed   By: Lorenza Cambridge M.D.   On: 03/16/2023 15:35   DG Pelvis 1-2 Views  Result Date: 03/16/2023 CLINICAL DATA:  Fall.  Lower back pain. EXAM: PELVIS - 1-2 VIEW COMPARISON:  None Available. FINDINGS: Osteopenia. There is no evidence of pelvic fracture or diastasis. The sacroiliac joints and pubic symphysis appear anatomically aligned. The hips are anatomically aligned with degenerative changes. Degenerative changes of the lumbar spine. IMPRESSION: No acute osseous abnormality on AP pelvis radiograph. Electronically Signed   By: Hart Robinsons M.D.   On: 03/16/2023 12:34   DG Chest Portable 1 View  Result Date: 03/16/2023 CLINICAL DATA:  Fall. EXAM: PORTABLE CHEST 1 VIEW COMPARISON:  Chest radiograph dated August 24, 2021. FINDINGS: The heart size and mediastinal contours are within normal limits. Aortic atherosclerosis. Chronic appearing coarse interstitial lung markings. No focal consolidation. No pneumothorax or pleural effusion. Degenerative changes of the thoracic spine and shoulders. No acute osseous abnormality. IMPRESSION: No acute findings in the chest. Electronically Signed   By: Hart Robinsons M.D.   On: 03/16/2023 12:31    Pertinent labs & imaging results that were available during my care of the patient were reviewed by me and considered in my medical decision making (see MDM for details).  Medications Ordered in ED Medications  morphine (PF) 4 MG/ML injection 4 mg (4 mg Intravenous Given 03/16/23 1655)                                                                                                                                     Procedures Procedures  (including critical care time)  Medical Decision Making / ED Course    Medical Decision Making:    Season Nakada is a 89 y.o. female with past medical history as below, significant for HLD, HTN, thyroid disease, prior  cholecystectomy, tubal ligation who presents to the ED with complaint of fall. The complaint involves an extensive differential diagnosis and also carries with it a high risk of complications and morbidity.  Serious etiology was considered. Ddx includes but is not limited to: Differential diagnoses for head trauma includes subdural hematoma, epidural hematoma, acute concussion, traumatic subarachnoid hemorrhage, cerebral contusions, etc.   Complete initial physical exam performed, notably the patient  was NAD, exam stable.    Reviewed and confirmed nursing documentation for past medical history, family history, social history.  Vital signs reviewed.    Clinical Course as of 03/16/23 1701  Tue Mar 16, 2023  1529 CK Total(!): 261 Pt with prolonged downtime,  her Cr is okay, tolerating PO, compartments soft  [SG]  1530 Sodium(!): 130 Similar to prior 1 yr ago [SG]  1538 CT with compression Fx L5. L4 osteophyte fx. ?meningioma [SG]    Clinical Course User Index [SG] Sloan Leiter, DO     Fall w/head injury, prolonged down time. Will get imaging/screening labs. Pt reports feeling much better at this point. Lives with grandson.   She has mild rhabdomyolysis, Cr stable. Tolerating PO  She has fx to her lumbar spine as above. ?meningioma on CTH.   Ordered TLSO, admission was recommended, pt wants to try and go home, will trial ambulation. If unable to walk safely will plan for admit.  Handoff to incoming EDP pending trial of ambulation.                Additional history obtained: -Additional history obtained from na -External records from outside source obtained and reviewed including: Chart review including previous notes, labs, imaging, consultation notes including  Prior ED eval last year PDMP    Lab Tests: -I ordered, reviewed, and interpreted labs.   The pertinent results include:   Labs Reviewed  CBC WITH DIFFERENTIAL/PLATELET - Abnormal; Notable for the following  components:      Result Value   RBC 3.75 (*)    Hemoglobin 10.7 (*)    HCT 33.0 (*)    All other components within normal limits  BASIC METABOLIC PANEL - Abnormal; Notable for the following components:   Sodium 130 (*)    Potassium 3.4 (*)    Chloride 95 (*)    All other components within normal limits  CK - Abnormal; Notable for the following components:   Total CK 261 (*)    All other components within normal limits    Notable for CK elev  EKG   EKG Interpretation Date/Time:    Ventricular Rate:    PR Interval:    QRS Duration:    QT Interval:    QTC Calculation:   R Axis:      Text Interpretation:           Imaging Studies ordered: I ordered imaging studies including CT head, ct spine I independently visualized the following imaging with scope of interpretation limited to determining acute life threatening conditions related to emergency care; findings noted above I independently visualized and interpreted imaging. I agree with the radiologist interpretation   Medicines ordered and prescription drug management: Meds ordered this encounter  Medications   morphine (PF) 4 MG/ML injection 4 mg   acetaminophen (TYLENOL) 325 MG tablet    Sig: Take 2 tablets (650 mg total) by mouth every 6 (six) hours as needed.    Dispense:  36 tablet    Refill:  0   oxyCODONE (ROXICODONE) 5 MG immediate release tablet    Sig: Take 0.5 tablets (2.5 mg total) by mouth every 8 (eight) hours as needed.    Dispense:  5 tablet    Refill:  0    -I have reviewed the patients home medicines and have made adjustments as needed   Consultations Obtained: na   Cardiac Monitoring: The patient was maintained on a cardiac monitor.  I personally viewed and interpreted the cardiac monitored which showed an underlying rhythm of: nsr Continuous pulse oximetry interpreted by myself, 99% on RA.    Social Determinants of Health:  Diagnosis or treatment significantly limited by social  determinants of health: former smoker   Reevaluation: After the interventions noted above,  I reevaluated the patient and found that they have improved  Co morbidities that complicate the patient evaluation  Past Medical History:  Diagnosis Date   High cholesterol    Hypertension    Thyroid disease       Dispostion: Disposition decision including need for hospitalization was considered, and patient disposition pending at time of sign out.    Final Clinical Impression(s) / ED Diagnoses Final diagnoses:  Fall, initial encounter  Compression fracture of lumbar vertebra, unspecified lumbar vertebral level, initial encounter (HCC)  Abnormal CT of the head  Traumatic rhabdomyolysis, initial encounter (HCC)        Sloan Leiter, DO 03/16/23 1702

## 2023-03-16 NOTE — Plan of Care (Signed)
  Problem: Education: Goal: Knowledge of General Education information will improve Description Including pain rating scale, medication(s)/side effects and non-pharmacologic comfort measures Outcome: Progressing   

## 2023-03-16 NOTE — ED Notes (Signed)
Patient is unable to ambulate without severe pain of the left hip and leg. MD made aware

## 2023-03-16 NOTE — Progress Notes (Signed)
Orthopedic Tech Progress Note Patient Details:  Erika Nguyen 1927/02/27 [id number]  Ortho Devices Type of Ortho Device: Thoracolumbar corset (TLSO) Ortho Device/Splint Location: Back Ortho Device/Splint Interventions: Ordered      Erika Nguyen 03/16/2023, 7:28 PM

## 2023-03-16 NOTE — ED Notes (Signed)
ED TO INPATIENT HANDOFF REPORT  ED Nurse Name and Phone #:   S Name/Age/Gender Erika Nguyen 89 y.o. female Room/Bed: 015C/015C  Code Status   Code Status: Limited: Do not attempt resuscitation (DNR) -DNR-LIMITED -Do Not Intubate/DNI   Home/SNF/Other Home = Is this baseline? Yes   Triage Complete: Triage complete  Chief Complaint Fall [W19.XXXA]  Triage Note Patient BIB EMS from home had a fall about 2300 last night. Tripped going to restroom. No LOC, did not head hit. No blood thinner.  C/o lower back pain. No deformities noted. 160/60, CBG 108, 70. Fent IV. Abrasion on left forehead.    Allergies No Known Allergies  Level of Care/Admitting Diagnosis ED Disposition     ED Disposition  Admit   Condition  --   Comment  Hospital Area: [facility] [[id number]]  Level of Care: Med-Surg [16]  May place patient in observation at [facility] or [facility] if equivalent level of care is available:: No  Covid Evaluation: Asymptomatic - no recent exposure (last 10 days) testing not required  Diagnosis: Fall [[id number]]  Admitting Physician: Leatha Gilding [phone]  Attending Physician: Leatha Gilding [phone]          B Medical/Surgery History Past Medical History:  Diagnosis Date   High cholesterol    Hypertension    Thyroid disease    Past Surgical History:  Procedure Laterality Date   CHOLECYSTECTOMY     HEMORRHOID SURGERY     TUBAL LIGATION       A IV Location/Drains/Wounds Patient Lines/Drains/Airways Status     Active Line/Drains/Airways     Name Placement date Placement time Site Days   Peripheral IV 03/16/23 20 G Right Antecubital 03/16/23  1037  Antecubital  less than 1            Intake/Output Last 24 hours No intake or output data in the 24 hours ending 03/16/23 1745  Labs/Imaging Results for orders placed or performed during the hospital encounter of 03/16/23 (from the past 48 hour(s))  CBC with Differential      Status: Abnormal   Collection Time: 03/16/23 11:13 AM  Result Value Ref Range   WBC 8.3 4.0 - 10.5 K/uL   RBC 3.75 (L) 3.87 - 5.11 MIL/uL   Hemoglobin 10.7 (L) 12.0 - 15.0 g/dL   HCT 41.3 (L) 24.4 - 01.0 %   MCV 88.0 80.0 - 100.0 fL   MCH 28.5 26.0 - 34.0 pg   MCHC 32.4 30.0 - 36.0 g/dL   RDW 27.2 53.6 - 64.4 %   Platelets 217 150 - 400 K/uL   nRBC 0.0 0.0 - 0.2 %   Neutrophils Relative % 77 %   Neutro Abs 6.3 1.7 - 7.7 K/uL   Lymphocytes Relative 12 %   Lymphs Abs 1.0 0.7 - 4.0 K/uL   Monocytes Relative 9 %   Monocytes Absolute 0.8 0.1 - 1.0 K/uL   Eosinophils Relative 1 %   Eosinophils Absolute 0.1 0.0 - 0.5 K/uL   Basophils Relative 0 %   Basophils Absolute 0.0 0.0 - 0.1 K/uL   Immature Granulocytes 1 %   Abs Immature Granulocytes 0.05 0.00 - 0.07 K/uL    Comment: Performed at [facility], [address] [place], [place] [address]  Basic metabolic panel     Status: Abnormal   Collection Time: 03/16/23 11:13 AM  Result Value Ref Range   Sodium 130 (L) 135 - 145 mmol/L  Potassium 3.4 (L) 3.5 - 5.1 mmol/L   Chloride 95 (L) 98 - 111 mmol/L   CO2 25 22 - 32 mmol/L   Glucose, Bld 96 70 - 99 mg/dL    Comment: Glucose reference range applies only to samples taken after fasting for at least 8 hours.   BUN 20 8 - 23 mg/dL   Creatinine, Ser 8.29 0.44 - 1.00 mg/dL   Calcium 9.0 8.9 - 56.2 mg/dL   GFR, Estimated >13 >08 mL/min    Comment: (NOTE) Calculated using the CKD-EPI Creatinine Equation (2021)    Anion gap 10 5 - 15    Comment: Performed at [facility], [address] [place], [place] [address]  CK     Status: Abnormal   Collection Time: 03/16/23 11:13 AM  Result Value Ref Range   Total CK 261 (H) 38 - 234 U/L    Comment: Performed at [facility], [address] [place], [place] [address]   CT Head Wo Contrast  Result Date: 03/16/2023 CLINICAL DATA:  Head trauma, minor (Age >= 65y); Low back pain, trauma; Neck trauma (Age >= 65y); Mid-back  pain EXAM: CT HEAD WITHOUT CONTRAST CT CERVICAL SPINE WITHOUT CONTRAST CT THORACIC SPINE WITHOUT CONTRAST CT LUMBAR SPINE WITHOUT CONTRAST TECHNIQUE: Multidetector CT imaging of the head, cervical spine, thoracic spine, lumbar spine was performed following the standard protocol without intravenous contrast. Multiplanar CT image reconstructions of the cervical spine were also generated. RADIATION DOSE REDUCTION: This exam was performed according to the departmental dose-optimization program which includes automated exposure control, adjustment of the mA and/or kV according to patient size and/or use of iterative reconstruction technique. COMPARISON:  None Available. FINDINGS: CT HEAD FINDINGS Brain: No hemorrhage. No hydrocephalus. No extra-axial fluid collection. No CT evidence of an acute cortical infarct. No mass effect. Generalized volume loss without lobar predominance. There is a 0.5 x 1.1 cm extra-axial lesion along the right sigmoid sinus. This is favored to represent a meningioma. Vascular: No hyperdense vessel or unexpected calcification. Skull: Normal. Negative for fracture or focal lesion. Sinuses/Orbits: No middle ear or mastoid effusion. Paranasal sinuses are notable for mild mucosal thickening of the floor of bilateral maxillary sinuses. Bilateral lens replacement. Orbits are otherwise unremarkable. Other: None. CT CERVICAL SPINE FINDINGS Alignment: Grade 1 anterolisthesis of C2 on C3. Trace retrolisthesis of C3 on C4. Skull base and vertebrae: No acute fracture. No primary bone lesion or focal pathologic process. Soft tissues and spinal canal: No prevertebral fluid or swelling. No visible canal hematoma. Disc levels: No evidence of high-grade spinal canal stenosis Upper chest: There is a 3 mm solid pulmonary nodule in the right upper lobe (series 5, image 83). Aortic atherosclerotic calcifications. CT THORACIC SPINE FINDINGS Alignment: Rightward curvature of the lumbar spine Vertebrae: No acute  fracture or focal pathologic process. Paraspinal and other soft tissues: 3 mm solid pulmonary nodule in the peripheral aspect of the right middle lobe. Coronary artery calcifications. Aortic atherosclerotic calcifications. Status post cholecystectomy Disc levels: No evidence of high-grade spinal canal stenosis. CT LUMBAR SPINE FINDINGS Segmentation: Transitional anatomy with lumbarization of S1. Last well-formed disc space is labeled S1-S2. Alignment: There is rightward curvature of the lumbar spine. Vertebrae: There is an acute superior endplate compression deformity at L5. There is also a nondisplaced fracture through the lateral osteophyte at the inferior endplate of L4 (series 9, image 52) Paraspinal and other soft tissues: There is soft tissue stranding along the left lateral aspect of the L4 vertebral  body, favored to be posttraumatic. Disc levels: There are multilevel degenerative disc bulges that results in moderate to severe spinal canal narrowing at L4-L5 and L5-S1. IMPRESSION: 1. No acute intracranial abnormality. 2. No acute fracture or traumatic listhesis of the cervical, thoracic, or lumbar spine. 3. Acute superior endplate compression deformity at L5. 4. Nondisplaced fracture through the lateral osteophyte at the inferior endplate of L4. 5. Multilevel degenerative disc bulges that results in moderate to severe spinal canal narrowing at L4-L5 and L5-S1. 6. There is a 0.5 x 1.1 cm extra-axial lesion along the right sigmoid sinus. This is favored to represent a meningioma. Recommend comparison prior imaging. Aortic Atherosclerosis (ICD10-I70.0). Electronically Signed   By: Lorenza Cambridge M.D.   On: 03/16/2023 15:35   CT Cervical Spine Wo Contrast  Result Date: 03/16/2023 CLINICAL DATA:  Head trauma, minor (Age >= 65y); Low back pain, trauma; Neck trauma (Age >= 65y); Mid-back pain EXAM: CT HEAD WITHOUT CONTRAST CT CERVICAL SPINE WITHOUT CONTRAST CT THORACIC SPINE WITHOUT CONTRAST CT LUMBAR SPINE  WITHOUT CONTRAST TECHNIQUE: Multidetector CT imaging of the head, cervical spine, thoracic spine, lumbar spine was performed following the standard protocol without intravenous contrast. Multiplanar CT image reconstructions of the cervical spine were also generated. RADIATION DOSE REDUCTION: This exam was performed according to the departmental dose-optimization program which includes automated exposure control, adjustment of the mA and/or kV according to patient size and/or use of iterative reconstruction technique. COMPARISON:  None Available. FINDINGS: CT HEAD FINDINGS Brain: No hemorrhage. No hydrocephalus. No extra-axial fluid collection. No CT evidence of an acute cortical infarct. No mass effect. Generalized volume loss without lobar predominance. There is a 0.5 x 1.1 cm extra-axial lesion along the right sigmoid sinus. This is favored to represent a meningioma. Vascular: No hyperdense vessel or unexpected calcification. Skull: Normal. Negative for fracture or focal lesion. Sinuses/Orbits: No middle ear or mastoid effusion. Paranasal sinuses are notable for mild mucosal thickening of the floor of bilateral maxillary sinuses. Bilateral lens replacement. Orbits are otherwise unremarkable. Other: None. CT CERVICAL SPINE FINDINGS Alignment: Grade 1 anterolisthesis of C2 on C3. Trace retrolisthesis of C3 on C4. Skull base and vertebrae: No acute fracture. No primary bone lesion or focal pathologic process. Soft tissues and spinal canal: No prevertebral fluid or swelling. No visible canal hematoma. Disc levels: No evidence of high-grade spinal canal stenosis Upper chest: There is a 3 mm solid pulmonary nodule in the right upper lobe (series 5, image 83). Aortic atherosclerotic calcifications. CT THORACIC SPINE FINDINGS Alignment: Rightward curvature of the lumbar spine Vertebrae: No acute fracture or focal pathologic process. Paraspinal and other soft tissues: 3 mm solid pulmonary nodule in the peripheral aspect of  the right middle lobe. Coronary artery calcifications. Aortic atherosclerotic calcifications. Status post cholecystectomy Disc levels: No evidence of high-grade spinal canal stenosis. CT LUMBAR SPINE FINDINGS Segmentation: Transitional anatomy with lumbarization of S1. Last well-formed disc space is labeled S1-S2. Alignment: There is rightward curvature of the lumbar spine. Vertebrae: There is an acute superior endplate compression deformity at L5. There is also a nondisplaced fracture through the lateral osteophyte at the inferior endplate of L4 (series 9, image 52) Paraspinal and other soft tissues: There is soft tissue stranding along the left lateral aspect of the L4 vertebral body, favored to be posttraumatic. Disc levels: There are multilevel degenerative disc bulges that results in moderate to severe spinal canal narrowing at L4-L5 and L5-S1. IMPRESSION: 1. No acute intracranial abnormality. 2. No acute fracture or traumatic listhesis of  the cervical, thoracic, or lumbar spine. 3. Acute superior endplate compression deformity at L5. 4. Nondisplaced fracture through the lateral osteophyte at the inferior endplate of L4. 5. Multilevel degenerative disc bulges that results in moderate to severe spinal canal narrowing at L4-L5 and L5-S1. 6. There is a 0.5 x 1.1 cm extra-axial lesion along the right sigmoid sinus. This is favored to represent a meningioma. Recommend comparison prior imaging. Aortic Atherosclerosis (ICD10-I70.0). Electronically Signed   By: Lorenza Cambridge M.D.   On: 03/16/2023 15:35   CT Lumbar Spine Wo Contrast  Result Date: 03/16/2023 CLINICAL DATA:  Head trauma, minor (Age >= 65y); Low back pain, trauma; Neck trauma (Age >= 65y); Mid-back pain EXAM: CT HEAD WITHOUT CONTRAST CT CERVICAL SPINE WITHOUT CONTRAST CT THORACIC SPINE WITHOUT CONTRAST CT LUMBAR SPINE WITHOUT CONTRAST TECHNIQUE: Multidetector CT imaging of the head, cervical spine, thoracic spine, lumbar spine was performed following  the standard protocol without intravenous contrast. Multiplanar CT image reconstructions of the cervical spine were also generated. RADIATION DOSE REDUCTION: This exam was performed according to the departmental dose-optimization program which includes automated exposure control, adjustment of the mA and/or kV according to patient size and/or use of iterative reconstruction technique. COMPARISON:  None Available. FINDINGS: CT HEAD FINDINGS Brain: No hemorrhage. No hydrocephalus. No extra-axial fluid collection. No CT evidence of an acute cortical infarct. No mass effect. Generalized volume loss without lobar predominance. There is a 0.5 x 1.1 cm extra-axial lesion along the right sigmoid sinus. This is favored to represent a meningioma. Vascular: No hyperdense vessel or unexpected calcification. Skull: Normal. Negative for fracture or focal lesion. Sinuses/Orbits: No middle ear or mastoid effusion. Paranasal sinuses are notable for mild mucosal thickening of the floor of bilateral maxillary sinuses. Bilateral lens replacement. Orbits are otherwise unremarkable. Other: None. CT CERVICAL SPINE FINDINGS Alignment: Grade 1 anterolisthesis of C2 on C3. Trace retrolisthesis of C3 on C4. Skull base and vertebrae: No acute fracture. No primary bone lesion or focal pathologic process. Soft tissues and spinal canal: No prevertebral fluid or swelling. No visible canal hematoma. Disc levels: No evidence of high-grade spinal canal stenosis Upper chest: There is a 3 mm solid pulmonary nodule in the right upper lobe (series 5, image 83). Aortic atherosclerotic calcifications. CT THORACIC SPINE FINDINGS Alignment: Rightward curvature of the lumbar spine Vertebrae: No acute fracture or focal pathologic process. Paraspinal and other soft tissues: 3 mm solid pulmonary nodule in the peripheral aspect of the right middle lobe. Coronary artery calcifications. Aortic atherosclerotic calcifications. Status post cholecystectomy Disc levels:  No evidence of high-grade spinal canal stenosis. CT LUMBAR SPINE FINDINGS Segmentation: Transitional anatomy with lumbarization of S1. Last well-formed disc space is labeled S1-S2. Alignment: There is rightward curvature of the lumbar spine. Vertebrae: There is an acute superior endplate compression deformity at L5. There is also a nondisplaced fracture through the lateral osteophyte at the inferior endplate of L4 (series 9, image 52) Paraspinal and other soft tissues: There is soft tissue stranding along the left lateral aspect of the L4 vertebral body, favored to be posttraumatic. Disc levels: There are multilevel degenerative disc bulges that results in moderate to severe spinal canal narrowing at L4-L5 and L5-S1. IMPRESSION: 1. No acute intracranial abnormality. 2. No acute fracture or traumatic listhesis of the cervical, thoracic, or lumbar spine. 3. Acute superior endplate compression deformity at L5. 4. Nondisplaced fracture through the lateral osteophyte at the inferior endplate of L4. 5. Multilevel degenerative disc bulges that results in moderate to severe spinal canal  narrowing at L4-L5 and L5-S1. 6. There is a 0.5 x 1.1 cm extra-axial lesion along the right sigmoid sinus. This is favored to represent a meningioma. Recommend comparison prior imaging. Aortic Atherosclerosis (ICD10-I70.0). Electronically Signed   By: Lorenza Cambridge M.D.   On: 03/16/2023 15:35   CT Thoracic Spine Wo Contrast  Result Date: 03/16/2023 CLINICAL DATA:  Head trauma, minor (Age >= 65y); Low back pain, trauma; Neck trauma (Age >= 65y); Mid-back pain EXAM: CT HEAD WITHOUT CONTRAST CT CERVICAL SPINE WITHOUT CONTRAST CT THORACIC SPINE WITHOUT CONTRAST CT LUMBAR SPINE WITHOUT CONTRAST TECHNIQUE: Multidetector CT imaging of the head, cervical spine, thoracic spine, lumbar spine was performed following the standard protocol without intravenous contrast. Multiplanar CT image reconstructions of the cervical spine were also generated.  RADIATION DOSE REDUCTION: This exam was performed according to the departmental dose-optimization program which includes automated exposure control, adjustment of the mA and/or kV according to patient size and/or use of iterative reconstruction technique. COMPARISON:  None Available. FINDINGS: CT HEAD FINDINGS Brain: No hemorrhage. No hydrocephalus. No extra-axial fluid collection. No CT evidence of an acute cortical infarct. No mass effect. Generalized volume loss without lobar predominance. There is a 0.5 x 1.1 cm extra-axial lesion along the right sigmoid sinus. This is favored to represent a meningioma. Vascular: No hyperdense vessel or unexpected calcification. Skull: Normal. Negative for fracture or focal lesion. Sinuses/Orbits: No middle ear or mastoid effusion. Paranasal sinuses are notable for mild mucosal thickening of the floor of bilateral maxillary sinuses. Bilateral lens replacement. Orbits are otherwise unremarkable. Other: None. CT CERVICAL SPINE FINDINGS Alignment: Grade 1 anterolisthesis of C2 on C3. Trace retrolisthesis of C3 on C4. Skull base and vertebrae: No acute fracture. No primary bone lesion or focal pathologic process. Soft tissues and spinal canal: No prevertebral fluid or swelling. No visible canal hematoma. Disc levels: No evidence of high-grade spinal canal stenosis Upper chest: There is a 3 mm solid pulmonary nodule in the right upper lobe (series 5, image 83). Aortic atherosclerotic calcifications. CT THORACIC SPINE FINDINGS Alignment: Rightward curvature of the lumbar spine Vertebrae: No acute fracture or focal pathologic process. Paraspinal and other soft tissues: 3 mm solid pulmonary nodule in the peripheral aspect of the right middle lobe. Coronary artery calcifications. Aortic atherosclerotic calcifications. Status post cholecystectomy Disc levels: No evidence of high-grade spinal canal stenosis. CT LUMBAR SPINE FINDINGS Segmentation: Transitional anatomy with lumbarization of  S1. Last well-formed disc space is labeled S1-S2. Alignment: There is rightward curvature of the lumbar spine. Vertebrae: There is an acute superior endplate compression deformity at L5. There is also a nondisplaced fracture through the lateral osteophyte at the inferior endplate of L4 (series 9, image 52) Paraspinal and other soft tissues: There is soft tissue stranding along the left lateral aspect of the L4 vertebral body, favored to be posttraumatic. Disc levels: There are multilevel degenerative disc bulges that results in moderate to severe spinal canal narrowing at L4-L5 and L5-S1. IMPRESSION: 1. No acute intracranial abnormality. 2. No acute fracture or traumatic listhesis of the cervical, thoracic, or lumbar spine. 3. Acute superior endplate compression deformity at L5. 4. Nondisplaced fracture through the lateral osteophyte at the inferior endplate of L4. 5. Multilevel degenerative disc bulges that results in moderate to severe spinal canal narrowing at L4-L5 and L5-S1. 6. There is a 0.5 x 1.1 cm extra-axial lesion along the right sigmoid sinus. This is favored to represent a meningioma. Recommend comparison prior imaging. Aortic Atherosclerosis (ICD10-I70.0). Electronically Signed   By: Sandria Senter  Celine Mans M.D.   On: 03/16/2023 15:35   DG Pelvis 1-2 Views  Result Date: 03/16/2023 CLINICAL DATA:  Fall.  Lower back pain. EXAM: PELVIS - 1-2 VIEW COMPARISON:  None Available. FINDINGS: Osteopenia. There is no evidence of pelvic fracture or diastasis. The sacroiliac joints and pubic symphysis appear anatomically aligned. The hips are anatomically aligned with degenerative changes. Degenerative changes of the lumbar spine. IMPRESSION: No acute osseous abnormality on AP pelvis radiograph. Electronically Signed   By: Hart Robinsons M.D.   On: 03/16/2023 12:34   DG Chest Portable 1 View  Result Date: 03/16/2023 CLINICAL DATA:  Fall. EXAM: PORTABLE CHEST 1 VIEW COMPARISON:  Chest radiograph dated August 24, 2021. FINDINGS: The heart size and mediastinal contours are within normal limits. Aortic atherosclerosis. Chronic appearing coarse interstitial lung markings. No focal consolidation. No pneumothorax or pleural effusion. Degenerative changes of the thoracic spine and shoulders. No acute osseous abnormality. IMPRESSION: No acute findings in the chest. Electronically Signed   By: Hart Robinsons M.D.   On: 03/16/2023 12:31    Pending Labs [employer] (From admission, onward)     Start     Ordered   Signed and Held  Brain natriuretic peptide  Tomorrow morning,   R        Signed and Held   Signed and Held  TSH  Tomorrow morning,   R        Signed and Held   Signed and Held  Comprehensive metabolic panel  Tomorrow morning,   R        Signed and Held   Signed and Held  CBC  Tomorrow morning,   R        Signed and Held            Vitals/Pain Today's Vitals   03/16/23 1451 03/16/23 1452 03/16/23 1630 03/16/23 1655  BP:   (!) 165/59   Pulse:   (!) 57   Resp:   17   Temp: 98 F (36.7 C)     TempSrc:      SpO2:   100%   PainSc:  0-No pain  10-Worst pain ever    Isolation Precautions No active isolations  Medications Medications  potassium chloride SA (KLOR-CON M) CR tablet 40 mEq (has no administration in time range)  morphine (PF) 4 MG/ML injection 4 mg (4 mg Intravenous Given 03/16/23 1655)    Mobility non-ambulatory     Focused Assessments    R Recommendations: See Admitting Provider Note  Report given to:   Additional Notes:

## 2023-03-17 ENCOUNTER — Encounter (HOSPITAL_COMMUNITY): Payer: Self-pay | Admitting: Internal Medicine

## 2023-03-17 DIAGNOSIS — W19XXXD Unspecified fall, subsequent encounter: Secondary | ICD-10-CM

## 2023-03-17 DIAGNOSIS — M25551 Pain in right hip: Secondary | ICD-10-CM | POA: Diagnosis not present

## 2023-03-17 LAB — CBC
HCT: 36.2 % (ref 36.0–46.0)
Hemoglobin: 11.8 g/dL — ABNORMAL LOW (ref 12.0–15.0)
MCH: 29.1 pg (ref 26.0–34.0)
MCHC: 32.6 g/dL (ref 30.0–36.0)
MCV: 89.4 fL (ref 80.0–100.0)
Platelets: 253 10*3/uL (ref 150–400)
RBC: 4.05 MIL/uL (ref 3.87–5.11)
RDW: 12.8 % (ref 11.5–15.5)
WBC: 8.4 10*3/uL (ref 4.0–10.5)
nRBC: 0 % (ref 0.0–0.2)

## 2023-03-17 LAB — COMPREHENSIVE METABOLIC PANEL
ALT: 15 U/L (ref 0–44)
AST: 24 U/L (ref 15–41)
Albumin: 3.3 g/dL — ABNORMAL LOW (ref 3.5–5.0)
Alkaline Phosphatase: 68 U/L (ref 38–126)
Anion gap: 12 (ref 5–15)
BUN: 20 mg/dL (ref 8–23)
CO2: 22 mmol/L (ref 22–32)
Calcium: 9.5 mg/dL (ref 8.9–10.3)
Chloride: 96 mmol/L — ABNORMAL LOW (ref 98–111)
Creatinine, Ser: 0.87 mg/dL (ref 0.44–1.00)
GFR, Estimated: >60 mL/min (ref >60)
Glucose, Bld: 78 mg/dL (ref 70–99)
Potassium: 4.9 mmol/L (ref 3.5–5.1)
Sodium: 130 mmol/L — ABNORMAL LOW (ref 135–145)
Total Bilirubin: 1 mg/dL (ref 0.3–1.2)
Total Protein: 6.4 g/dL — ABNORMAL LOW (ref 6.5–8.1)

## 2023-03-17 LAB — BRAIN NATRIURETIC PEPTIDE: B Natriuretic Peptide: 230.2 pg/mL — ABNORMAL HIGH (ref 0.0–100.0)

## 2023-03-17 LAB — TSH: TSH: 3.741 u[IU]/mL (ref 0.350–4.500)

## 2023-03-17 NOTE — [place] FL2 (Signed)
MEDICAID [id number] LEVEL OF CARE FORM     IDENTIFICATION  Patient Name: Erika Nguyen Birthdate: [birth year] Sex: female Admission Date (Current Location): 03/16/2023  [facility] and [place] Number:  Producer, television/film/video and Address:  [facility], [address], [place], [place] [address]      Provider Number: [id number]  Attending Physician Name and Address:  Glade Lloyd, MD  Relative Name and Phone Number:  Carrolyn Meiers Daughter [phone]    Current Level of Care: Hospital Recommended Level of Care: Skilled Nursing Facility Prior Approval Number:    Date Approved/Denied:   PASRR Number: [id number]  Discharge Plan: SNF    Current Diagnoses: Patient Active Problem List   Diagnosis Date Noted   Fall 03/16/2023    Orientation RESPIRATION BLADDER Height & Weight     Self, Time, Situation, Place  Normal Incontinent Weight:   Height:     BEHAVIORAL SYMPTOMS/MOOD NEUROLOGICAL BOWEL NUTRITION STATUS      Continent Diet (see discharge summary)  AMBULATORY STATUS COMMUNICATION OF NEEDS Skin   Limited Assist Verbally Normal                       Personal Care Assistance Level of Assistance  Bathing, Feeding, Dressing Bathing Assistance: Limited assistance Feeding assistance: Independent Dressing Assistance: Limited assistance     Functional Limitations Info  Sight, Hearing, Speech Sight Info: Adequate Hearing Info: Impaired Speech Info: Adequate    SPECIAL CARE FACTORS FREQUENCY  PT (By licensed PT), OT (By licensed OT)     PT Frequency: 5x week OT Frequency: 5x week            Contractures Contractures Info: Not present    Additional Factors Info  Code Status, Allergies Code Status Info: DNR Allergies Info: NKA           Current Medications (03/17/2023):  This is the current hospital active medication list Current Facility-Administered Medications  Medication Dose Route Frequency Provider Last Rate Last  Admin   0.9 %  sodium chloride infusion  250 mL Intravenous PRN Leatha Gilding, MD       acetaminophen (TYLENOL) tablet 650 mg  650 mg Oral Q6H PRN Leatha Gilding, MD       Or   acetaminophen (TYLENOL) suppository 650 mg  650 mg Rectal Q6H PRN Leatha Gilding, MD       aspirin EC tablet 81 mg  81 mg Oral Daily Pamella Pert M, MD   81 mg at 03/17/23 0931   benazepril (LOTENSIN) tablet 40 mg  40 mg Oral q AM Leatha Gilding, MD   40 mg at 03/17/23 0931   cyanocobalamin (VITAMIN B12) tablet 2,500 mcg  2,500 mcg Oral Daily Leatha Gilding, MD   2,500 mcg at 03/17/23 0931   enoxaparin (LOVENOX) injection 40 mg  40 mg Subcutaneous Q24H Pamella Pert M, MD   40 mg at 03/16/23 2050   ferrous sulfate tablet 325 mg  325 mg Oral Q breakfast Leatha Gilding, MD   325 mg at 03/17/23 0931   HYDROcodone-acetaminophen (NORCO/VICODIN) 5-325 MG per tablet 1 tablet  1 tablet Oral Q4H PRN Leatha Gilding, MD   1 tablet at 03/16/23 2049   levothyroxine (SYNTHROID) tablet 100 mcg  100 mcg Oral QAC breakfast Leatha Gilding, MD   100 mcg at 03/17/23 0604   morphine (PF) 2 MG/ML injection 1 mg  1 mg Intravenous Q4H PRN Gherghe, Costin  M, MD       ondansetron (ZOFRAN) tablet 4 mg  4 mg Oral Q6H PRN Leatha Gilding, MD       Or   ondansetron (ZOFRAN) injection 4 mg  4 mg Intravenous Q6H PRN Leatha Gilding, MD       pravastatin (PRAVACHOL) tablet 40 mg  40 mg Oral q AM Leatha Gilding, MD   40 mg at 03/17/23 0931   sertraline (ZOLOFT) tablet 25 mg  25 mg Oral QPM Leatha Gilding, MD   25 mg at 03/16/23 2049   sodium chloride flush (NS) 0.9 % injection 3 mL  3 mL Intravenous Q12H Leatha Gilding, MD   3 mL at 03/17/23 0931   sodium chloride flush (NS) 0.9 % injection 3 mL  3 mL Intravenous PRN Leatha Gilding, MD         Discharge Medications: Please see discharge summary for a list of discharge medications.  Relevant Imaging Results:  Relevant Lab Results:   Additional  Information SSN: [id number]  Lorri Frederick, LCSW

## 2023-03-17 NOTE — TOC CAGE-AID Note (Signed)
Transition of Care [facility]) - CAGE-AID Screening  Patient Details  Name: Erika Nguyen MRN: [id number] Date of Birth: 1927/05/16  Clinical Narrative:  Patient denies any alcohol or drug use, no need for substance abuse resources at this time.  CAGE-AID Screening:    Have You Ever Felt You Ought to Cut Down on Your Drinking or Drug Use?: No Have People Annoyed You By Critizing Your Drinking Or Drug Use?: No Have You Felt Bad Or Guilty About Your Drinking Or Drug Use?: No Have You Ever Had a Drink or Used Drugs First Thing In The Morning to Steady Your Nerves or to Get Rid of a Hangover?: No CAGE-AID Score: 0  Substance Abuse Education Offered: No

## 2023-03-17 NOTE — Care Management Obs Status (Signed)
MEDICARE OBSERVATION STATUS NOTIFICATION   Patient Details  Name: Ellina Pong MRN: [id number] Date of Birth: 18-Jan-1927   Medicare Observation Status Notification Given:  Yes    Lorri Frederick, LCSW 03/17/2023, 1:55 PM

## 2023-03-17 NOTE — Evaluation (Addendum)
Occupational Therapy Evaluation Patient Details Name: Erika Nguyen MRN: [id number] DOB: Jun 28, 1926 Today's Date: 03/17/2023   History of Present Illness Erika Nguyen is a 89 y.o. female who presented after a fall at home. Found to have nondisplaced fracture through the lateral osteophyte at the inferior endplate of L4, as well as multilevel degenerative disc bulges with moderate to severe spinal canal narrowing L4-L2 5 and L5-S1 - non-op. There is also small meningioma which appears to be incidental. PMHx: hypertension, hyperlipidemia, hypothyroidism   Clinical Impression   Erika Nguyen was evaluated s/p the above admission list. She is mod I at baseline, her grandson lives with her but works a few days a week. Attempted to call pt's daughter to inquire about 24/7 assist, no answer. Upon evaluation the pt was limited by back and LLE pain, knowledge of precautions and back brace, low BP, unsteady gait, decreased activity tolerance and generalized weakness. Overall she needed up to min A +2 for transfers and mobility with RW. Due to the deficits listed below the pt also needs up to mod A for LB ADLs and set up A for UB ADLs in sitting. Cues and education given throughout for spinal precautions and compensatory techniques. Pt will benefit from continued acute OT services and skilled inpatient follow up therapy, <3 hours/day for safety due to lack of reliable 24/7 assist from family.        If plan is discharge home, recommend the following: A little help with walking and/or transfers;A lot of help with bathing/dressing/bathroom;Assistance with cooking/housework;Direct supervision/assist for medications management;Direct supervision/assist for financial management;Assist for transportation;Help with stairs or ramp for entrance    Functional Status Assessment  Patient has had a recent decline in their functional status and demonstrates the ability to make significant improvements in function in a reasonable  and predictable amount of time.  Equipment Recommendations  Other (comment) (Youth RW)       Precautions / Restrictions Precautions Precautions: Fall;Back Precaution Booklet Issued: No Precaution Comments: reviewed compensatory techniques for back precuatiobs Required Braces or Orthoses: Spinal Brace Spinal Brace: Thoracolumbosacral orthotic;Applied in sitting position Restrictions Weight Bearing Restrictions: No      Mobility Bed Mobility Overal bed mobility: Needs Assistance Bed Mobility: Rolling, Sidelying to Sit Rolling: Supervision Sidelying to sit: Contact guard assist       General bed mobility comments: cue for log roll, pt attempting to long sit several times prior to education    Transfers Overall transfer level: Needs assistance Equipment used: Rolling walker (2 wheels) Transfers: Sit to/from Stand Sit to Stand: Min assist           General transfer comment: min A +2 from lower surfaces, CGA from elevated      Balance Overall balance assessment: Needs assistance Sitting-balance support: Feet supported Sitting balance-Leahy Scale: Good     Standing balance support: Bilateral upper extremity supported Standing balance-Leahy Scale: Poor                             ADL either performed or assessed with clinical judgement   ADL Overall ADL's : Needs assistance/impaired Eating/Feeding: Independent   Grooming: Contact guard assist;Standing   Upper Body Bathing: Set up;Sitting   Lower Body Bathing: Moderate assistance;Sit to/from stand   Upper Body Dressing : Set up;Sitting Upper Body Dressing Details (indicate cue type and reason): total A for TLSO Lower Body Dressing: Moderate assistance;Sit to/from stand   Toilet Transfer: Contact guard assist;Ambulation;Rolling walker (2  wheels);Grab bars   Toileting- Clothing Manipulation and Hygiene: Maximal assistance;Sit to/from stand Toileting - Clothing Manipulation Details (indicate cue  type and reason): pt benefits from BUE supported in standing     Functional mobility during ADLs: Minimal assistance;Rolling walker (2 wheels) General ADL Comments: min A needed to stand from lower surfaces, GCA for elevated and mobility wtih RW. mod A needed for LB ADLs due to LLE weakness and pain     Vision Baseline Vision/History: 0 No visual deficits Vision Assessment?: No apparent visual deficits     Perception Perception: Within Functional Limits       Praxis Praxis: WFL       Pertinent Vitals/Pain Pain Assessment Pain Assessment: Faces Faces Pain Scale: Hurts little more Pain Location: back LLE Pain Descriptors / Indicators: Discomfort, Grimacing Pain Intervention(s): Limited activity within patient's tolerance, Monitored during session     Extremity/Trunk Assessment Upper Extremity Assessment Upper Extremity Assessment: Generalized weakness   Lower Extremity Assessment Lower Extremity Assessment: Defer to PT evaluation   Cervical / Trunk Assessment Cervical / Trunk Assessment: Other exceptions Cervical / Trunk Exceptions: Lumbar compression fx   Communication Communication Communication: Hearing impairment   Cognition Arousal: Alert Behavior During Therapy: WFL for tasks assessed/performed Overall Cognitive Status: No family/caregiver present to determine baseline cognitive functioning                                 General Comments: Overall WFL for most tasks, pt wtih limited insight to deficits/implications. Likely at her baseline cognition. Per chart, family suspects mild dementia     General Comments  pt reported feeling light headed during walk to bathroom, BP while sitting on the toilet:79/62, BP after walk back to chair" 101/61            Home Living Family/patient expects to be discharged to:: Private residence Living Arrangements: Other relatives (grandson) Available Help at Discharge: Family;Available PRN/intermittently (pt  states her daughter is retired - attempted to call dtr with no aswer) Type of Home: Mobile home Home Access: Stairs to enter [employer] of Steps: 3 Entrance Stairs-Rails: Right Home Layout: One level     Bathroom Shower/Tub: Sponge bathes at baseline   Bathroom Toilet: Handicapped height     Home Equipment: Rollator (4 wheels);Cane - single point   Additional Comments: pt states he has a backbrace at home?      Prior Functioning/Environment Prior Level of Function : Needs assist             Mobility Comments: mod I, denies falls other than the one leading to admission ADLs Comments: mod I, sponge bathes.        OT Problem List: Decreased strength;Decreased range of motion;Decreased activity tolerance;Impaired balance (sitting and/or standing);Decreased safety awareness;Decreased cognition;Decreased knowledge of use of DME or AE;Decreased knowledge of precautions;Pain      OT Treatment/Interventions: Self-care/ADL training;Therapeutic exercise;DME and/or AE instruction;Therapeutic activities;Patient/family education;Balance training    OT Goals(Current goals can be found in the care plan section) Acute Rehab OT Goals Patient Stated Goal: home OT Goal Formulation: With patient Time For Goal Achievement: 03/31/23 Potential to Achieve Goals: Good ADL Goals Pt Will Perform Lower Body Bathing: with supervision Pt Will Perform Lower Body Dressing: with min assist;sit to/from stand Pt Will Transfer to Toilet: with supervision;ambulating Additional ADL Goal #1: Pt will don TLSO with min A Additional ADL Goal #2: Pt will recall back precuations with  minimal  cues  OT Frequency: Min 1X/week    Co-evaluation PT/OT/SLP Co-Evaluation/Treatment: Yes Reason for Co-Treatment: Complexity of the patient's impairments (multi-system involvement);For patient/therapist safety   OT goals addressed during session: ADL's and self-care      AM-PAC OT "6 Clicks" Daily  Activity     Outcome Measure Help from another person eating meals?: None Help from another person taking care of personal grooming?: A Little Help from another person toileting, which includes using toliet, bedpan, or urinal?: A Lot Help from another person bathing (including washing, rinsing, drying)?: A Lot Help from another person to put on and taking off regular upper body clothing?: A Little Help from another person to put on and taking off regular lower body clothing?: A Lot 6 Click Score: 16   End of Session Equipment Utilized During Treatment: Rolling walker (2 wheels);Back brace Nurse Communication: Mobility status  Activity Tolerance: Patient tolerated treatment well Patient left: in chair;with call bell/phone within reach;with chair alarm set  OT Visit Diagnosis: Unsteadiness on feet (R26.81);Other abnormalities of gait and mobility (R26.89);Muscle weakness (generalized) (M62.81)                Time: 1002-1030 OT Time Calculation (min): 28 min Charges:  OT General Charges $OT Visit: 1 Visit OT Evaluation $OT Eval Moderate Complexity: 1 Mod  Derenda Mis, OTR/L Acute Rehabilitation Services Office [phone] Secure Chat Communication Preferred   Donia Pounds 03/17/2023, 10:20 AM

## 2023-03-17 NOTE — Progress Notes (Signed)
PROGRESS NOTE    Erika Nguyen  XBM:[id number] DOB: 1926/07/05 DOA: 03/16/2023 PCP: Roger Kill, PA-C   Brief Narrative:  89 y.o. female who lives independently, with medical history significant of hypertension, hyperlipidemia, hypothyroidism presented after a ground-level fall at home and was on the floor for several hours without loss of consciousness.  On presentation, CT scanning of entire spine as well as head showed L5 superior endplate compression deformity, nondisplaced fracture through the lateral osteophyte at the inferior endplate of L4, as well as multilevel degenerative disc bulges with moderate to severe spinal canal narrowing L4-L5 and L5-S1 with incidental meningioma.  She was fitted with TLSO brace but continued to have left leg weakness as well as severe back pain.  Assessment & Plan:   Ground-level fall Left lower extremity weakness Lumbar spine fracture/compression deformities Moderate to severe lumbar spinal canal stenosis -Presented with ground-level fall and left lower extremity weakness.  Imaging as above.  Continue TLSO brace. -Continue pain management.  Fall precautions.  PT eval.  Doubt that the patient will be a candidate for any kind of neurosurgical intervention -MRI of brain did not show any acute stroke  Essential hypertension Hyperlipidemia -Monitor blood pressure.  Continue benazepril and hydralazine -Continue statin  Hypothyroidism -Continue Synthroid  Dementia -Possibly has mild dementia.  Will need outpatient palliative care evaluation and follow-up.  Continue sertraline  Chronic lower extremity swelling -Outpatient follow-up.  Hydrochlorothiazide on hold  Left hip pain -Due to fall.  Plain x-rays without acute fracture.  PT eval.  Hyponatremia -Mild.  Encourage oral intake.  Hydrochlorothiazide on hold  Hypokalemia -Resolved.    DVT prophylaxis: Lovenox Code Status: DNR Family Communication: None at bedside Disposition  Plan: Status is: Observation The patient will require care spanning > 2 midnights and should be moved to inpatient because: Of severity of illness.  PT eval pending.    Consultants: None  Procedures: None  Antimicrobials: None   Subjective: Patient seen and examined at bedside.  Poor historian, slow to respond.  Hard of hearing.  No fever, vomiting or chest pain reported.  Complains of lower back pain and left-leg weakness  Objective: Vitals:   03/16/23 1824 03/16/23 2031 03/17/23 0415 03/17/23 0739  BP: (!) 179/65 138/63 (!) 104/53 (!) 154/60  Pulse: 74 67 69 61  Resp: 16     Temp: 97.6 F (36.4 C) 97.8 F (36.6 C) 98.1 F (36.7 C) 97.8 F (36.6 C)  TempSrc:  Oral Oral Oral  SpO2: 96% 97% 95% 100%    Intake/Output Summary (Last 24 hours) at 03/17/2023 1116 Last data filed at 03/16/2023 2126 Gross per 24 hour  Intake 3 ml  Output --  Net 3 ml   There were no vitals filed for this visit.  Examination:  General exam: Appears calm and comfortable.  On room air.  Looks chronically ill and deconditioned. Respiratory system: Bilateral decreased breath sounds at bases, no wheezing Cardiovascular system: S1 & S2 heard, Rate controlled Gastrointestinal system: Abdomen is nondistended, soft and nontender. Normal bowel sounds heard. Extremities: No cyanosis, clubbing; trace lower extremity edema present Central nervous system: Awake, extremely slow to respond.  Poor historian.  No focal neurological deficits. Moving extremities Skin: No rashes, lesions or ulcers Psychiatry: Flat affect.  Not agitated.    Data Reviewed: I have personally reviewed following labs and imaging studies  CBC: Recent Labs  Lab 03/16/23 1113 03/17/23 0813  WBC 8.3 8.4  NEUTROABS 6.3  --   HGB 10.7* 11.8*  HCT 33.0* 36.2  MCV 88.0 89.4  PLT 217 253   Basic Metabolic Panel: Recent Labs  Lab 03/16/23 1113 03/17/23 0813  NA 130* 130*  K 3.4* 4.9  CL 95* 96*  CO2 25 22  GLUCOSE 96  78  BUN 20 20  CREATININE 0.84 0.87  CALCIUM 9.0 9.5   GFR: CrCl cannot be calculated (Unknown ideal weight.). Liver Function Tests: Recent Labs  Lab 03/17/23 0813  AST 24  ALT 15  ALKPHOS 68  BILITOT 1.0  PROT 6.4*  ALBUMIN 3.3*   No results for input(s): "LIPASE", "AMYLASE" in the last 168 hours. No results for input(s): "AMMONIA" in the last 168 hours. Coagulation Profile: No results for input(s): "INR", "PROTIME" in the last 168 hours. Cardiac Enzymes: Recent Labs  Lab 03/16/23 1113  CKTOTAL 261*   BNP (last 3 results) No results for input(s): "PROBNP" in the last 8760 hours. HbA1C: No results for input(s): "HGBA1C" in the last 72 hours. CBG: No results for input(s): "GLUCAP" in the last 168 hours. Lipid Profile: No results for input(s): "CHOL", "HDL", "LDLCALC", "TRIG", "CHOLHDL", "LDLDIRECT" in the last 72 hours. Thyroid Function Tests: Recent Labs    03/17/23 0813  TSH 3.741   Anemia Panel: No results for input(s): "VITAMINB12", "FOLATE", "FERRITIN", "TIBC", "IRON", "RETICCTPCT" in the last 72 hours. Sepsis Labs: No results for input(s): "PROCALCITON", "LATICACIDVEN" in the last 168 hours.  No results found for this or any previous visit (from the past 240 hour(s)).       Radiology Studies: MR BRAIN WO CONTRAST  Result Date: 03/16/2023 CLINICAL DATA:  Altered mental status EXAM: MRI HEAD WITHOUT CONTRAST TECHNIQUE: Multiplanar, multiecho pulse sequences of the brain and surrounding structures were obtained without intravenous contrast. COMPARISON:  None Available. FINDINGS: Brain: No acute infarct, mass effect or extra-axial collection. No acute or chronic hemorrhage. There is multifocal hyperintense T2-weighted signal within the white matter. Parenchymal volume and CSF spaces are normal. Extra-axial mass in the right retromastoid region, measuring 1.5 cm. Midline structures are normal. Vascular: Normal flow voids Skull and upper cervical spine: Normal  calvarium and skull base. Visualized upper cervical spine and soft tissues are normal. Sinuses/Orbits:Paranasal sinuses are clear. No mastoid effusion. Normal orbits. Ocular lens replacements. IMPRESSION: 1. No acute intracranial abnormality. 2. Chronic small vessel ischemia. 3. Extra-axial mass in the right retromastoid region, measuring 1.5 cm, most consistent with a meningioma. Electronically Signed   By: Deatra Robinson M.D.   On: 03/16/2023 22:47   CT Head Wo Contrast  Result Date: 03/16/2023 CLINICAL DATA:  Head trauma, minor (Age >= 65y); Low back pain, trauma; Neck trauma (Age >= 65y); Mid-back pain EXAM: CT HEAD WITHOUT CONTRAST CT CERVICAL SPINE WITHOUT CONTRAST CT THORACIC SPINE WITHOUT CONTRAST CT LUMBAR SPINE WITHOUT CONTRAST TECHNIQUE: Multidetector CT imaging of the head, cervical spine, thoracic spine, lumbar spine was performed following the standard protocol without intravenous contrast. Multiplanar CT image reconstructions of the cervical spine were also generated. RADIATION DOSE REDUCTION: This exam was performed according to the departmental dose-optimization program which includes automated exposure control, adjustment of the mA and/or kV according to patient size and/or use of iterative reconstruction technique. COMPARISON:  None Available. FINDINGS: CT HEAD FINDINGS Brain: No hemorrhage. No hydrocephalus. No extra-axial fluid collection. No CT evidence of an acute cortical infarct. No mass effect. Generalized volume loss without lobar predominance. There is a 0.5 x 1.1 cm extra-axial lesion along the right sigmoid sinus. This is favored to represent a meningioma. Vascular: No  hyperdense vessel or unexpected calcification. Skull: Normal. Negative for fracture or focal lesion. Sinuses/Orbits: No middle ear or mastoid effusion. Paranasal sinuses are notable for mild mucosal thickening of the floor of bilateral maxillary sinuses. Bilateral lens replacement. Orbits are otherwise unremarkable.  Other: None. CT CERVICAL SPINE FINDINGS Alignment: Grade 1 anterolisthesis of C2 on C3. Trace retrolisthesis of C3 on C4. Skull base and vertebrae: No acute fracture. No primary bone lesion or focal pathologic process. Soft tissues and spinal canal: No prevertebral fluid or swelling. No visible canal hematoma. Disc levels: No evidence of high-grade spinal canal stenosis Upper chest: There is a 3 mm solid pulmonary nodule in the right upper lobe (series 5, image 83). Aortic atherosclerotic calcifications. CT THORACIC SPINE FINDINGS Alignment: Rightward curvature of the lumbar spine Vertebrae: No acute fracture or focal pathologic process. Paraspinal and other soft tissues: 3 mm solid pulmonary nodule in the peripheral aspect of the right middle lobe. Coronary artery calcifications. Aortic atherosclerotic calcifications. Status post cholecystectomy Disc levels: No evidence of high-grade spinal canal stenosis. CT LUMBAR SPINE FINDINGS Segmentation: Transitional anatomy with lumbarization of S1. Last well-formed disc space is labeled S1-S2. Alignment: There is rightward curvature of the lumbar spine. Vertebrae: There is an acute superior endplate compression deformity at L5. There is also a nondisplaced fracture through the lateral osteophyte at the inferior endplate of L4 (series 9, image 52) Paraspinal and other soft tissues: There is soft tissue stranding along the left lateral aspect of the L4 vertebral body, favored to be posttraumatic. Disc levels: There are multilevel degenerative disc bulges that results in moderate to severe spinal canal narrowing at L4-L5 and L5-S1. IMPRESSION: 1. No acute intracranial abnormality. 2. No acute fracture or traumatic listhesis of the cervical, thoracic, or lumbar spine. 3. Acute superior endplate compression deformity at L5. 4. Nondisplaced fracture through the lateral osteophyte at the inferior endplate of L4. 5. Multilevel degenerative disc bulges that results in moderate to  severe spinal canal narrowing at L4-L5 and L5-S1. 6. There is a 0.5 x 1.1 cm extra-axial lesion along the right sigmoid sinus. This is favored to represent a meningioma. Recommend comparison prior imaging. Aortic Atherosclerosis (ICD10-I70.0). Electronically Signed   By: Lorenza Cambridge M.D.   On: 03/16/2023 15:35   CT Cervical Spine Wo Contrast  Result Date: 03/16/2023 CLINICAL DATA:  Head trauma, minor (Age >= 65y); Low back pain, trauma; Neck trauma (Age >= 65y); Mid-back pain EXAM: CT HEAD WITHOUT CONTRAST CT CERVICAL SPINE WITHOUT CONTRAST CT THORACIC SPINE WITHOUT CONTRAST CT LUMBAR SPINE WITHOUT CONTRAST TECHNIQUE: Multidetector CT imaging of the head, cervical spine, thoracic spine, lumbar spine was performed following the standard protocol without intravenous contrast. Multiplanar CT image reconstructions of the cervical spine were also generated. RADIATION DOSE REDUCTION: This exam was performed according to the departmental dose-optimization program which includes automated exposure control, adjustment of the mA and/or kV according to patient size and/or use of iterative reconstruction technique. COMPARISON:  None Available. FINDINGS: CT HEAD FINDINGS Brain: No hemorrhage. No hydrocephalus. No extra-axial fluid collection. No CT evidence of an acute cortical infarct. No mass effect. Generalized volume loss without lobar predominance. There is a 0.5 x 1.1 cm extra-axial lesion along the right sigmoid sinus. This is favored to represent a meningioma. Vascular: No hyperdense vessel or unexpected calcification. Skull: Normal. Negative for fracture or focal lesion. Sinuses/Orbits: No middle ear or mastoid effusion. Paranasal sinuses are notable for mild mucosal thickening of the floor of bilateral maxillary sinuses. Bilateral lens replacement. Orbits are  otherwise unremarkable. Other: None. CT CERVICAL SPINE FINDINGS Alignment: Grade 1 anterolisthesis of C2 on C3. Trace retrolisthesis of C3 on C4. Skull  base and vertebrae: No acute fracture. No primary bone lesion or focal pathologic process. Soft tissues and spinal canal: No prevertebral fluid or swelling. No visible canal hematoma. Disc levels: No evidence of high-grade spinal canal stenosis Upper chest: There is a 3 mm solid pulmonary nodule in the right upper lobe (series 5, image 83). Aortic atherosclerotic calcifications. CT THORACIC SPINE FINDINGS Alignment: Rightward curvature of the lumbar spine Vertebrae: No acute fracture or focal pathologic process. Paraspinal and other soft tissues: 3 mm solid pulmonary nodule in the peripheral aspect of the right middle lobe. Coronary artery calcifications. Aortic atherosclerotic calcifications. Status post cholecystectomy Disc levels: No evidence of high-grade spinal canal stenosis. CT LUMBAR SPINE FINDINGS Segmentation: Transitional anatomy with lumbarization of S1. Last well-formed disc space is labeled S1-S2. Alignment: There is rightward curvature of the lumbar spine. Vertebrae: There is an acute superior endplate compression deformity at L5. There is also a nondisplaced fracture through the lateral osteophyte at the inferior endplate of L4 (series 9, image 52) Paraspinal and other soft tissues: There is soft tissue stranding along the left lateral aspect of the L4 vertebral body, favored to be posttraumatic. Disc levels: There are multilevel degenerative disc bulges that results in moderate to severe spinal canal narrowing at L4-L5 and L5-S1. IMPRESSION: 1. No acute intracranial abnormality. 2. No acute fracture or traumatic listhesis of the cervical, thoracic, or lumbar spine. 3. Acute superior endplate compression deformity at L5. 4. Nondisplaced fracture through the lateral osteophyte at the inferior endplate of L4. 5. Multilevel degenerative disc bulges that results in moderate to severe spinal canal narrowing at L4-L5 and L5-S1. 6. There is a 0.5 x 1.1 cm extra-axial lesion along the right sigmoid sinus.  This is favored to represent a meningioma. Recommend comparison prior imaging. Aortic Atherosclerosis (ICD10-I70.0). Electronically Signed   By: Lorenza Cambridge M.D.   On: 03/16/2023 15:35   CT Lumbar Spine Wo Contrast  Result Date: 03/16/2023 CLINICAL DATA:  Head trauma, minor (Age >= 65y); Low back pain, trauma; Neck trauma (Age >= 65y); Mid-back pain EXAM: CT HEAD WITHOUT CONTRAST CT CERVICAL SPINE WITHOUT CONTRAST CT THORACIC SPINE WITHOUT CONTRAST CT LUMBAR SPINE WITHOUT CONTRAST TECHNIQUE: Multidetector CT imaging of the head, cervical spine, thoracic spine, lumbar spine was performed following the standard protocol without intravenous contrast. Multiplanar CT image reconstructions of the cervical spine were also generated. RADIATION DOSE REDUCTION: This exam was performed according to the departmental dose-optimization program which includes automated exposure control, adjustment of the mA and/or kV according to patient size and/or use of iterative reconstruction technique. COMPARISON:  None Available. FINDINGS: CT HEAD FINDINGS Brain: No hemorrhage. No hydrocephalus. No extra-axial fluid collection. No CT evidence of an acute cortical infarct. No mass effect. Generalized volume loss without lobar predominance. There is a 0.5 x 1.1 cm extra-axial lesion along the right sigmoid sinus. This is favored to represent a meningioma. Vascular: No hyperdense vessel or unexpected calcification. Skull: Normal. Negative for fracture or focal lesion. Sinuses/Orbits: No middle ear or mastoid effusion. Paranasal sinuses are notable for mild mucosal thickening of the floor of bilateral maxillary sinuses. Bilateral lens replacement. Orbits are otherwise unremarkable. Other: None. CT CERVICAL SPINE FINDINGS Alignment: Grade 1 anterolisthesis of C2 on C3. Trace retrolisthesis of C3 on C4. Skull base and vertebrae: No acute fracture. No primary bone lesion or focal pathologic process. Soft tissues and  spinal canal: No  prevertebral fluid or swelling. No visible canal hematoma. Disc levels: No evidence of high-grade spinal canal stenosis Upper chest: There is a 3 mm solid pulmonary nodule in the right upper lobe (series 5, image 83). Aortic atherosclerotic calcifications. CT THORACIC SPINE FINDINGS Alignment: Rightward curvature of the lumbar spine Vertebrae: No acute fracture or focal pathologic process. Paraspinal and other soft tissues: 3 mm solid pulmonary nodule in the peripheral aspect of the right middle lobe. Coronary artery calcifications. Aortic atherosclerotic calcifications. Status post cholecystectomy Disc levels: No evidence of high-grade spinal canal stenosis. CT LUMBAR SPINE FINDINGS Segmentation: Transitional anatomy with lumbarization of S1. Last well-formed disc space is labeled S1-S2. Alignment: There is rightward curvature of the lumbar spine. Vertebrae: There is an acute superior endplate compression deformity at L5. There is also a nondisplaced fracture through the lateral osteophyte at the inferior endplate of L4 (series 9, image 52) Paraspinal and other soft tissues: There is soft tissue stranding along the left lateral aspect of the L4 vertebral body, favored to be posttraumatic. Disc levels: There are multilevel degenerative disc bulges that results in moderate to severe spinal canal narrowing at L4-L5 and L5-S1. IMPRESSION: 1. No acute intracranial abnormality. 2. No acute fracture or traumatic listhesis of the cervical, thoracic, or lumbar spine. 3. Acute superior endplate compression deformity at L5. 4. Nondisplaced fracture through the lateral osteophyte at the inferior endplate of L4. 5. Multilevel degenerative disc bulges that results in moderate to severe spinal canal narrowing at L4-L5 and L5-S1. 6. There is a 0.5 x 1.1 cm extra-axial lesion along the right sigmoid sinus. This is favored to represent a meningioma. Recommend comparison prior imaging. Aortic Atherosclerosis (ICD10-I70.0).  Electronically Signed   By: Lorenza Cambridge M.D.   On: 03/16/2023 15:35   CT Thoracic Spine Wo Contrast  Result Date: 03/16/2023 CLINICAL DATA:  Head trauma, minor (Age >= 65y); Low back pain, trauma; Neck trauma (Age >= 65y); Mid-back pain EXAM: CT HEAD WITHOUT CONTRAST CT CERVICAL SPINE WITHOUT CONTRAST CT THORACIC SPINE WITHOUT CONTRAST CT LUMBAR SPINE WITHOUT CONTRAST TECHNIQUE: Multidetector CT imaging of the head, cervical spine, thoracic spine, lumbar spine was performed following the standard protocol without intravenous contrast. Multiplanar CT image reconstructions of the cervical spine were also generated. RADIATION DOSE REDUCTION: This exam was performed according to the departmental dose-optimization program which includes automated exposure control, adjustment of the mA and/or kV according to patient size and/or use of iterative reconstruction technique. COMPARISON:  None Available. FINDINGS: CT HEAD FINDINGS Brain: No hemorrhage. No hydrocephalus. No extra-axial fluid collection. No CT evidence of an acute cortical infarct. No mass effect. Generalized volume loss without lobar predominance. There is a 0.5 x 1.1 cm extra-axial lesion along the right sigmoid sinus. This is favored to represent a meningioma. Vascular: No hyperdense vessel or unexpected calcification. Skull: Normal. Negative for fracture or focal lesion. Sinuses/Orbits: No middle ear or mastoid effusion. Paranasal sinuses are notable for mild mucosal thickening of the floor of bilateral maxillary sinuses. Bilateral lens replacement. Orbits are otherwise unremarkable. Other: None. CT CERVICAL SPINE FINDINGS Alignment: Grade 1 anterolisthesis of C2 on C3. Trace retrolisthesis of C3 on C4. Skull base and vertebrae: No acute fracture. No primary bone lesion or focal pathologic process. Soft tissues and spinal canal: No prevertebral fluid or swelling. No visible canal hematoma. Disc levels: No evidence of high-grade spinal canal stenosis  Upper chest: There is a 3 mm solid pulmonary nodule in the right upper lobe (series 5, image 83).  Aortic atherosclerotic calcifications. CT THORACIC SPINE FINDINGS Alignment: Rightward curvature of the lumbar spine Vertebrae: No acute fracture or focal pathologic process. Paraspinal and other soft tissues: 3 mm solid pulmonary nodule in the peripheral aspect of the right middle lobe. Coronary artery calcifications. Aortic atherosclerotic calcifications. Status post cholecystectomy Disc levels: No evidence of high-grade spinal canal stenosis. CT LUMBAR SPINE FINDINGS Segmentation: Transitional anatomy with lumbarization of S1. Last well-formed disc space is labeled S1-S2. Alignment: There is rightward curvature of the lumbar spine. Vertebrae: There is an acute superior endplate compression deformity at L5. There is also a nondisplaced fracture through the lateral osteophyte at the inferior endplate of L4 (series 9, image 52) Paraspinal and other soft tissues: There is soft tissue stranding along the left lateral aspect of the L4 vertebral body, favored to be posttraumatic. Disc levels: There are multilevel degenerative disc bulges that results in moderate to severe spinal canal narrowing at L4-L5 and L5-S1. IMPRESSION: 1. No acute intracranial abnormality. 2. No acute fracture or traumatic listhesis of the cervical, thoracic, or lumbar spine. 3. Acute superior endplate compression deformity at L5. 4. Nondisplaced fracture through the lateral osteophyte at the inferior endplate of L4. 5. Multilevel degenerative disc bulges that results in moderate to severe spinal canal narrowing at L4-L5 and L5-S1. 6. There is a 0.5 x 1.1 cm extra-axial lesion along the right sigmoid sinus. This is favored to represent a meningioma. Recommend comparison prior imaging. Aortic Atherosclerosis (ICD10-I70.0). Electronically Signed   By: Lorenza Cambridge M.D.   On: 03/16/2023 15:35   DG Pelvis 1-2 Views  Result Date: 03/16/2023 CLINICAL  DATA:  Fall.  Lower back pain. EXAM: PELVIS - 1-2 VIEW COMPARISON:  None Available. FINDINGS: Osteopenia. There is no evidence of pelvic fracture or diastasis. The sacroiliac joints and pubic symphysis appear anatomically aligned. The hips are anatomically aligned with degenerative changes. Degenerative changes of the lumbar spine. IMPRESSION: No acute osseous abnormality on AP pelvis radiograph. Electronically Signed   By: Hart Robinsons M.D.   On: 03/16/2023 12:34   DG Chest Portable 1 View  Result Date: 03/16/2023 CLINICAL DATA:  Fall. EXAM: PORTABLE CHEST 1 VIEW COMPARISON:  Chest radiograph dated August 24, 2021. FINDINGS: The heart size and mediastinal contours are within normal limits. Aortic atherosclerosis. Chronic appearing coarse interstitial lung markings. No focal consolidation. No pneumothorax or pleural effusion. Degenerative changes of the thoracic spine and shoulders. No acute osseous abnormality. IMPRESSION: No acute findings in the chest. Electronically Signed   By: Hart Robinsons M.D.   On: 03/16/2023 12:31        Scheduled Meds:  aspirin EC  81 mg Oral Daily   benazepril  40 mg Oral q AM   cyanocobalamin  2,500 mcg Oral Daily   enoxaparin (LOVENOX) injection  40 mg Subcutaneous Q24H   ferrous sulfate  325 mg Oral Q breakfast   hydrALAZINE  10 mg Oral BID   levothyroxine  100 mcg Oral QAC breakfast   pravastatin  40 mg Oral q AM   sertraline  25 mg Oral QPM   sodium chloride flush  3 mL Intravenous Q12H   Continuous Infusions:  sodium chloride            Glade Lloyd, MD [facility] 03/17/2023, 11:16 AM

## 2023-03-17 NOTE — Plan of Care (Signed)
  Problem: Clinical Measurements: Goal: Will remain free from infection Outcome: Progressing Goal: Cardiovascular complication will be avoided Outcome: Progressing   Problem: Safety: Goal: Ability to remain free from injury will improve Outcome: Progressing

## 2023-03-17 NOTE — TOC Initial Note (Signed)
Transition of Care [facility]) - Initial/Assessment Note    Patient Details  Name: Erika Nguyen MRN: [id number] Date of Birth: May 25, 1926  Transition of [facility]) CM/SW Contact:    Lorri Frederick, LCSW Phone Number: 03/17/2023, 2:30 PM  Clinical Narrative:   CSW informed that pt recommended for SNF due to family concerns about the grandson that pt currently lives with.  CSW intercepted by pt sister Rivka Barbara prior to entering the room to discuss this. Also present is Glenda's husband Jonny Ruiz and pt's daughter Jill Side.  They are under the impression that pt is unable to walk, discussed that pt is walking per PT note.  All of [place] entered the room to include pt in discussion.  Pt hard of hearing but participates in some of the conversation.  Per Rivka Barbara, grandson is able to help "some" but not reliable as a full time caregiver.  Family is not able to provide 24/7 help.  Discussed SNF and all, including pt, are agreeable to SNF.  Pt initially resistant as she thinks SNF would be permanent, but after it was explained that she would be at SNF temporarily, she is in agreement.  Medicare choice document provided, [facility].  Referral sent out in hub.  [facility] is full at [facility] time.  CSW reached out to [facility] to review.                  Expected Discharge Plan: Skilled Nursing Facility Barriers to Discharge: Continued Medical Work up, SNF Pending bed offer   Patient Goals and CMS Choice   CMS Medicare.gov Compare Post Acute Care list provided to:: Patient Represenative (must comment) (sister Glenda) Choice offered to / list presented to : Sibling      Expected Discharge Plan and Services In-house Referral: Clinical Social Work   Post Acute Care Choice: Skilled Nursing Facility Living arrangements for the past 2 months: Single Family Home                                      Prior Living Arrangements/Services Living arrangements  for the past 2 months: Single Family Home Lives with:: Relatives (lives with a grandson or nephew) Patient language and need for interpreter reviewed:: Yes Do you feel safe going back to the place where you live?: Yes      Need for Family Participation in Patient Care: Yes (Comment) Care giver support system in place?: Yes (comment) Current home services: Other (comment) (none) Criminal Activity/Legal Involvement Pertinent to Current Situation/Hospitalization: No - Comment as needed  Activities of Daily Living   ADL Screening (condition at time of admission) Independently performs ADLs?: Yes (appropriate for developmental age) Is the patient deaf or have difficulty hearing?: Yes Does the patient have difficulty seeing, even when wearing glasses/contacts?: No Does the patient have difficulty concentrating, remembering, or making decisions?: No  Permission Sought/Granted         Permission granted to share info w AGENCY: SNF        Emotional Assessment Appearance:: Appears stated age Attitude/Demeanor/Rapport: Engaged Affect (typically observed): Appropriate Orientation: : Oriented to Self, Oriented to Place, Oriented to  Time, Oriented to Situation      Admission diagnosis:  Fall [W19.XXXA] Abnormal CT of the head [R93.0] Fall, initial encounter [W19.XXXA] Traumatic rhabdomyolysis, initial encounter (HCC) [T79.6XXA] Compression fracture of lumbar vertebra, unspecified lumbar vertebral level, initial encounter (HCC) [  S32.000A] Patient Active Problem List   Diagnosis Date Noted   Fall 03/16/2023   PCP:  Roger Kill, PA-C Pharmacy:   [facility] [phone] - [place], [place] - [address] [place] [address] Phone: [phone] Fax: [phone]     Social Determinants of Health (SDOH) Social History: SDOH Screenings   Food Insecurity: No Food Insecurity (03/16/2023)  Housing: Low Risk   (03/16/2023)  Transportation Needs: No Transportation Needs (03/16/2023)  Utilities: Not At Risk (03/16/2023)  Social Connections: Unknown (09/30/2021)   [facility] from [facility]  Tobacco Use: Medium Risk (01/07/2023)   Received from [facility]   SDOH Interventions:     Readmission Risk Interventions     No data to display

## 2023-03-17 NOTE — Evaluation (Addendum)
Physical Therapy Evaluation Patient Details Name: Nevin Deneault MRN: [id number] DOB: 29-Jun-1926 Today's Date: 03/17/2023  History of Present Illness  Patient is a 89 year old female with ground level fall and was down for several hours. Found to have L4 and L5 compression deformities, fitted with a TLSO brace. Unable to ambulate in ED with LLE weakness which family reports has been ongoing for several weeks.   Clinical Impression  Patient is agreeable to PT evaluation. She reports her grandson lives at home with her. She reports only the one fall at home. She is modified independent with mobility at baseline and uses a single point cane intermittently with mobility. She has 3-4 steps to enter her home.  Today, the patient required cues for logroll technique. She needed assistance do donn the TLSO brace prior to standing. Patient was able to stand and ambulate 57ft with the rolling walker with Min A initially, progressing to CGA with increased ambulation distance. Patient reports mild to moderate pain in the left leg that increased slightly with weight bearing. The patient seems adamant about return home at discharge. She will likely need intermittent assistance from family to return home safely. Recommend to continue PT to maximize independence and decrease caregiver burden.  Addendum: Family confirms patient does not have 24/7 supervision/assist at home and are seeking placement. Recommend rehabilitation < 3 hours/day after this hospital stay as patient is not currently safe to be home alone.       If plan is discharge home, recommend the following: A little help with walking and/or transfers;A little help with bathing/dressing/bathroom;Assistance with cooking/housework;Help with stairs or ramp for entrance;Assist for transportation   Can travel by private vehicle        Equipment Recommendations Rolling walker (2 wheels)  Recommendations for Other Services       Functional Status  Assessment Patient has had a recent decline in their functional status and demonstrates the ability to make significant improvements in function in a reasonable and predictable amount of time.     Precautions / Restrictions Precautions Precautions: Fall;Back Precaution Booklet Issued: No Precaution Comments: reviewed compensatory techniques for back precuatiobs Required Braces or Orthoses: Spinal Brace Spinal Brace: Thoracolumbosacral orthotic;Applied in sitting position Restrictions Weight Bearing Restrictions: No      Mobility  Bed Mobility Overal bed mobility: Needs Assistance Bed Mobility: Rolling, Sidelying to Sit Rolling: Supervision Sidelying to sit: Contact guard assist       General bed mobility comments: education on logroll technique. patient attempted several times to long sit in bed with frequent cues required for safety    Transfers Overall transfer level: Needs assistance Equipment used: Rolling walker (2 wheels) Transfers: Sit to/from Stand Sit to Stand: Min assist           General transfer comment: Min A +2 person from lower surface and CGA from higher surface    Ambulation/Gait Ambulation/Gait assistance: Contact guard assist, Min assist Gait Distance (Feet): 25 Feet Assistive device: Rolling walker (2 wheels) Gait Pattern/deviations: Step-to pattern, Decreased stride length, Decreased stance time - left, Antalgic Gait velocity: decreased     General Gait Details: steadying assistance required initially, progressing to CGA with increased ambulation distance. verbal cues for using rolling walker for support to off load LLE. patient reports mild to moderate pain in the left leg with slight increase with weight bearing (anterior and lateral thigh area)  Stairs            Wheelchair Mobility     Tilt  Bed    Modified Rankin (Stroke Patients Only)       Balance Overall balance assessment: Needs assistance Sitting-balance support: Feet  supported Sitting balance-Leahy Scale: Good     Standing balance support: Bilateral upper extremity supported Standing balance-Leahy Scale: Poor Standing balance comment: external support required                             Pertinent Vitals/Pain Pain Assessment Pain Assessment: Faces Faces Pain Scale: Hurts little more Pain Location: back, anterior/lateral thigh LLE Pain Descriptors / Indicators: Discomfort, Grimacing Pain Intervention(s): Limited activity within patient's tolerance, Monitored during session, Repositioned    Home Living Family/patient expects to be discharged to:: Private residence Living Arrangements: Other relatives Available Help at Discharge: Family;Available PRN/intermittently Type of Home: Mobile home Home Access: Stairs to enter Entrance Stairs-Rails: Right Entrance Stairs-Number of Steps: 3   Home Layout: One level Home Equipment: Rollator (4 wheels);Cane - single point Additional Comments: pt states he has a backbrace at home?    Prior Function Prior Level of Function : Needs assist             Mobility Comments: mod I, denies falls other than the one leading to admission. intermittent use of cane for ambulation recently ADLs Comments: mod I, sponge bathes.     Extremity/Trunk Assessment   Upper Extremity Assessment Upper Extremity Assessment: Generalized weakness    Lower Extremity Assessment Lower Extremity Assessment: Overall WFL for tasks assessed    Cervical / Trunk Assessment Cervical / Trunk Assessment: Other exceptions Cervical / Trunk Exceptions: Lumbar compression fx  Communication   Communication Communication: Hearing impairment Cueing Techniques: Verbal cues  Cognition Arousal: Alert Behavior During Therapy: WFL for tasks assessed/performed Overall Cognitive Status: No family/caregiver present to determine baseline cognitive functioning                                          General  Comments General comments (skin integrity, edema, etc.): patient reports feeling light headed with ambulation. blood pressure 79/62 initially and 101/61 after seated rest break. maximal assistance required for donning TLSO    Exercises     Assessment/Plan    PT Assessment Patient needs continued PT services  PT Problem List Decreased strength;Decreased range of motion;Decreased activity tolerance;Decreased balance;Decreased mobility;Decreased safety awareness;Pain       PT Treatment Interventions DME instruction;Gait training;Stair training;Functional mobility training;Therapeutic activities;Therapeutic exercise;Balance training;Neuromuscular re-education;Cognitive remediation;Patient/family education    PT Goals (Current goals can be found in the Care Plan section)  Acute Rehab PT Goals Patient Stated Goal: to go home PT Goal Formulation: With patient Time For Goal Achievement: 03/31/23 Potential to Achieve Goals: Fair    Frequency Min 1X/week     Co-evaluation PT/OT/SLP Co-Evaluation/Treatment: Yes Reason for Co-Treatment: Complexity of the patient's impairments (multi-system involvement);Necessary to address cognition/behavior during functional activity   OT goals addressed during session: ADL's and self-care       AM-PAC PT "6 Clicks" Mobility  Outcome Measure Help needed turning from your back to your side while in a flat bed without using bedrails?: None Help needed moving from lying on your back to sitting on the side of a flat bed without using bedrails?: A Little Help needed moving to and from a bed to a chair (including a wheelchair)?: A Little Help needed standing up from a chair  using your arms (e.g., wheelchair or bedside chair)?: A Little Help needed to walk in hospital room?: A Little Help needed climbing 3-5 steps with a railing? : A Lot 6 Click Score: 18    End of Session Equipment Utilized During Treatment: Back brace Activity Tolerance: Patient  tolerated treatment well Patient left: in chair;with call bell/phone within reach;with chair alarm set Nurse Communication: Mobility status PT Visit Diagnosis: Unsteadiness on feet (R26.81);Muscle weakness (generalized) (M62.81)    Time: 4098-1191 PT Time Calculation (min) (ACUTE ONLY): 32 min   Charges:   PT Evaluation $PT Eval Low Complexity: 1 Low   PT General Charges $$ ACUTE PT VISIT: 1 Visit         Donna Bernard, PT, MPT   Ina Homes 03/17/2023, 11:32 AM

## 2023-03-18 DIAGNOSIS — M25551 Pain in right hip: Secondary | ICD-10-CM | POA: Diagnosis not present

## 2023-03-18 DIAGNOSIS — W19XXXD Unspecified fall, subsequent encounter: Secondary | ICD-10-CM | POA: Diagnosis not present

## 2023-03-18 NOTE — Progress Notes (Addendum)
This nurse attempted to call [facility] to give report on patient prior to discharge. Unable to speak with anyone.  1420: unsuccessful attempt to give report to [employer] by this nurse.  1520: report given to [place], LPN at [facility]. Patient's family to transport. Patient transported to private vehicle via wheelchair. Family members given discharge packet.

## 2023-03-18 NOTE — Discharge Summary (Signed)
Physician Discharge Summary  Erika Nguyen GNF:[id number] DOB: August 30, 1926 DOA: 03/16/2023  PCP: Roger Kill, PA-C  Admit date: 03/16/2023 Discharge date: 03/18/2023  Admitted From: Home Disposition: SNF  Recommendations for Outpatient Follow-up:  Follow up with SNF provider at earliest convenience Recommend outpatient evaluation and follow-up by palliative care Follow up in ED if symptoms worsen or new appear   Home Health: No Equipment/Devices: None  Discharge Condition: Stable CODE STATUS: DNR Diet recommendation: Heart healthy  Brief/Interim Summary: 89 y.o. female who lives independently, with medical history significant of hypertension, hyperlipidemia, hypothyroidism presented after a ground-level fall at home and was on the floor for several hours without loss of consciousness.  On presentation, CT scanning of entire spine as well as head showed L5 superior endplate compression deformity, nondisplaced fracture through the lateral osteophyte at the inferior endplate of L4, as well as multilevel degenerative disc bulges with moderate to severe spinal canal narrowing L4-L5 and L5-S1 with incidental meningioma.  She was fitted with TLSO brace but continued to have left leg weakness as well as severe back pain.  PT recommended SNF placement.  She will be discharged to SNF once bed is available.  Discharge Diagnoses:   Ground-level fall Left lower extremity weakness Lumbar spine fracture/compression deformities Moderate to severe lumbar spinal canal stenosis -Presented with ground-level fall and left lower extremity weakness.  Imaging as above.  Continue TLSO brace. -Continue pain management with Tylenol as needed.  Fall precautions.  Doubt that the patient will be a candidate for any kind of neurosurgical intervention -MRI of brain did not show any acute stroke - PT recommended SNF placement.  She will be discharged to SNF once bed is available.   Essential  hypertension Hyperlipidemia -Monitor blood pressure.  Continue benazepril and hydralazine.  Hydrochlorothiazide and valsartan to remain on hold -Continue statin   Hypothyroidism -Continue Synthroid   Dementia -Possibly has mild dementia.  Will need outpatient palliative care evaluation and follow-up.  Continue sertraline   Chronic lower extremity swelling -Outpatient follow-up.  Hydrochlorothiazide on hold   Left hip pain -Due to fall.  Plain x-rays without acute fracture.  PT eval.   Hyponatremia -Mild.  Encourage oral intake.  Hydrochlorothiazide on hold.  Labs pending today.  Outpatient follow-up of BMP.   Hypokalemia -Resolved.  Discharge Instructions  Discharge Instructions     Diet - low sodium heart healthy   Complete by: As directed    Increase activity slowly   Complete by: As directed       Allergies as of 03/18/2023   No Known Allergies      Medication List     STOP taking these medications    hydrochlorothiazide 25 MG tablet Commonly known as: HYDRODIURIL   valsartan 320 MG tablet Commonly known as: DIOVAN       TAKE these medications    acetaminophen 325 MG tablet Commonly known as: Tylenol Take 2 tablets (650 mg total) by mouth every 6 (six) hours as needed.   aspirin EC 81 MG tablet Take 81 mg by mouth daily.   benazepril 40 MG tablet Commonly known as: LOTENSIN Take 40 mg by mouth in the morning.   Carbonyl Iron 15 MG Chew Chew 15 mg by mouth daily.   cholecalciferol 25 MCG (1000 UNIT) tablet Commonly known as: VITAMIN D3 Take 1,000 Units by mouth daily.   Cyanocobalamin 2500 MCG Tabs Take 2,500 mcg by mouth daily.   estradiol 0.1 MG/GM vaginal cream Commonly known as: ESTRACE Place 1  Applicatorful vaginally See admin instructions. Use on Tuesdays and Thursdays   hydrALAZINE 10 MG tablet Commonly known as: APRESOLINE Take 1 tablet by mouth 2 (two) times daily.   levothyroxine 100 MCG tablet Commonly known as:  SYNTHROID Take 100 mcg by mouth daily before breakfast.   pravastatin 40 MG tablet Commonly known as: PRAVACHOL Take 1 tablet by mouth in the morning.   sertraline 25 MG tablet Commonly known as: ZOLOFT Take 1 tablet by mouth every evening.        Contact information for follow-up providers     Call  Lisbeth Renshaw, MD.   Specialty: Neurosurgery Why: Follow up from ER visit Contact information: [address] [place] [address] [phone]         Call  Roger Kill, PA-C.   Specialty: Physician Assistant Why: Follow up from ER visit Contact information: [address] [place] [address] [phone]         Go to  [facility] Emergency Department at [facility].   Specialty: Emergency Medicine Why: As needed, If symptoms worsen Contact information: [address] [place] [address] [phone]             Contact information for after-discharge care     Destination     [facility] Preferred SNF .   Service: Skilled Nursing Contact information: [address] [place] [address] [phone]                    No Known Allergies  Consultations: None   Procedures/Studies: MR BRAIN WO CONTRAST  Result Date: 03/16/2023 CLINICAL DATA:  Altered mental status EXAM: MRI HEAD WITHOUT CONTRAST TECHNIQUE: Multiplanar, multiecho pulse sequences of the brain and surrounding structures were obtained without intravenous contrast. COMPARISON:  None Available. FINDINGS: Brain: No acute infarct, mass effect or extra-axial collection. No acute or chronic hemorrhage. There is multifocal hyperintense T2-weighted signal within the white matter. Parenchymal volume and CSF spaces are normal. Extra-axial mass in the right retromastoid region, measuring 1.5 cm. Midline structures are normal. Vascular: Normal flow voids Skull and upper cervical spine:  Normal calvarium and skull base. Visualized upper cervical spine and soft tissues are normal. Sinuses/Orbits:Paranasal sinuses are clear. No mastoid effusion. Normal orbits. Ocular lens replacements. IMPRESSION: 1. No acute intracranial abnormality. 2. Chronic small vessel ischemia. 3. Extra-axial mass in the right retromastoid region, measuring 1.5 cm, most consistent with a meningioma. Electronically Signed   By: Deatra Robinson M.D.   On: 03/16/2023 22:47   CT Head Wo Contrast  Result Date: 03/16/2023 CLINICAL DATA:  Head trauma, minor (Age >= 65y); Low back pain, trauma; Neck trauma (Age >= 65y); Mid-back pain EXAM: CT HEAD WITHOUT CONTRAST CT CERVICAL SPINE WITHOUT CONTRAST CT THORACIC SPINE WITHOUT CONTRAST CT LUMBAR SPINE WITHOUT CONTRAST TECHNIQUE: Multidetector CT imaging of the head, cervical spine, thoracic spine, lumbar spine was performed following the standard protocol without intravenous contrast. Multiplanar CT image reconstructions of the cervical spine were also generated. RADIATION DOSE REDUCTION: This exam was performed according to the departmental dose-optimization program which includes automated exposure control, adjustment of the mA and/or kV according to patient size and/or use of iterative reconstruction technique. COMPARISON:  None Available. FINDINGS: CT HEAD FINDINGS Brain: No hemorrhage. No hydrocephalus. No extra-axial fluid collection. No CT evidence of an acute cortical infarct. No mass effect. Generalized volume loss without lobar predominance. There is a 0.5 x 1.1 cm  extra-axial lesion along the right sigmoid sinus. This is favored to represent a meningioma. Vascular: No hyperdense vessel or unexpected calcification. Skull: Normal. Negative for fracture or focal lesion. Sinuses/Orbits: No middle ear or mastoid effusion. Paranasal sinuses are notable for mild mucosal thickening of the floor of bilateral maxillary sinuses. Bilateral lens replacement. Orbits are otherwise  unremarkable. Other: None. CT CERVICAL SPINE FINDINGS Alignment: Grade 1 anterolisthesis of C2 on C3. Trace retrolisthesis of C3 on C4. Skull base and vertebrae: No acute fracture. No primary bone lesion or focal pathologic process. Soft tissues and spinal canal: No prevertebral fluid or swelling. No visible canal hematoma. Disc levels: No evidence of high-grade spinal canal stenosis Upper chest: There is a 3 mm solid pulmonary nodule in the right upper lobe (series 5, image 83). Aortic atherosclerotic calcifications. CT THORACIC SPINE FINDINGS Alignment: Rightward curvature of the lumbar spine Vertebrae: No acute fracture or focal pathologic process. Paraspinal and other soft tissues: 3 mm solid pulmonary nodule in the peripheral aspect of the right middle lobe. Coronary artery calcifications. Aortic atherosclerotic calcifications. Status post cholecystectomy Disc levels: No evidence of high-grade spinal canal stenosis. CT LUMBAR SPINE FINDINGS Segmentation: Transitional anatomy with lumbarization of S1. Last well-formed disc space is labeled S1-S2. Alignment: There is rightward curvature of the lumbar spine. Vertebrae: There is an acute superior endplate compression deformity at L5. There is also a nondisplaced fracture through the lateral osteophyte at the inferior endplate of L4 (series 9, image 52) Paraspinal and other soft tissues: There is soft tissue stranding along the left lateral aspect of the L4 vertebral body, favored to be posttraumatic. Disc levels: There are multilevel degenerative disc bulges that results in moderate to severe spinal canal narrowing at L4-L5 and L5-S1. IMPRESSION: 1. No acute intracranial abnormality. 2. No acute fracture or traumatic listhesis of the cervical, thoracic, or lumbar spine. 3. Acute superior endplate compression deformity at L5. 4. Nondisplaced fracture through the lateral osteophyte at the inferior endplate of L4. 5. Multilevel degenerative disc bulges that results in  moderate to severe spinal canal narrowing at L4-L5 and L5-S1. 6. There is a 0.5 x 1.1 cm extra-axial lesion along the right sigmoid sinus. This is favored to represent a meningioma. Recommend comparison prior imaging. Aortic Atherosclerosis (ICD10-I70.0). Electronically Signed   By: Lorenza Cambridge M.D.   On: 03/16/2023 15:35   CT Cervical Spine Wo Contrast  Result Date: 03/16/2023 CLINICAL DATA:  Head trauma, minor (Age >= 65y); Low back pain, trauma; Neck trauma (Age >= 65y); Mid-back pain EXAM: CT HEAD WITHOUT CONTRAST CT CERVICAL SPINE WITHOUT CONTRAST CT THORACIC SPINE WITHOUT CONTRAST CT LUMBAR SPINE WITHOUT CONTRAST TECHNIQUE: Multidetector CT imaging of the head, cervical spine, thoracic spine, lumbar spine was performed following the standard protocol without intravenous contrast. Multiplanar CT image reconstructions of the cervical spine were also generated. RADIATION DOSE REDUCTION: This exam was performed according to the departmental dose-optimization program which includes automated exposure control, adjustment of the mA and/or kV according to patient size and/or use of iterative reconstruction technique. COMPARISON:  None Available. FINDINGS: CT HEAD FINDINGS Brain: No hemorrhage. No hydrocephalus. No extra-axial fluid collection. No CT evidence of an acute cortical infarct. No mass effect. Generalized volume loss without lobar predominance. There is a 0.5 x 1.1 cm extra-axial lesion along the right sigmoid sinus. This is favored to represent a meningioma. Vascular: No hyperdense vessel or unexpected calcification. Skull: Normal. Negative for fracture or focal lesion. Sinuses/Orbits: No middle ear or mastoid effusion. Paranasal sinuses are notable  for mild mucosal thickening of the floor of bilateral maxillary sinuses. Bilateral lens replacement. Orbits are otherwise unremarkable. Other: None. CT CERVICAL SPINE FINDINGS Alignment: Grade 1 anterolisthesis of C2 on C3. Trace retrolisthesis of C3 on  C4. Skull base and vertebrae: No acute fracture. No primary bone lesion or focal pathologic process. Soft tissues and spinal canal: No prevertebral fluid or swelling. No visible canal hematoma. Disc levels: No evidence of high-grade spinal canal stenosis Upper chest: There is a 3 mm solid pulmonary nodule in the right upper lobe (series 5, image 83). Aortic atherosclerotic calcifications. CT THORACIC SPINE FINDINGS Alignment: Rightward curvature of the lumbar spine Vertebrae: No acute fracture or focal pathologic process. Paraspinal and other soft tissues: 3 mm solid pulmonary nodule in the peripheral aspect of the right middle lobe. Coronary artery calcifications. Aortic atherosclerotic calcifications. Status post cholecystectomy Disc levels: No evidence of high-grade spinal canal stenosis. CT LUMBAR SPINE FINDINGS Segmentation: Transitional anatomy with lumbarization of S1. Last well-formed disc space is labeled S1-S2. Alignment: There is rightward curvature of the lumbar spine. Vertebrae: There is an acute superior endplate compression deformity at L5. There is also a nondisplaced fracture through the lateral osteophyte at the inferior endplate of L4 (series 9, image 52) Paraspinal and other soft tissues: There is soft tissue stranding along the left lateral aspect of the L4 vertebral body, favored to be posttraumatic. Disc levels: There are multilevel degenerative disc bulges that results in moderate to severe spinal canal narrowing at L4-L5 and L5-S1. IMPRESSION: 1. No acute intracranial abnormality. 2. No acute fracture or traumatic listhesis of the cervical, thoracic, or lumbar spine. 3. Acute superior endplate compression deformity at L5. 4. Nondisplaced fracture through the lateral osteophyte at the inferior endplate of L4. 5. Multilevel degenerative disc bulges that results in moderate to severe spinal canal narrowing at L4-L5 and L5-S1. 6. There is a 0.5 x 1.1 cm extra-axial lesion along the right sigmoid  sinus. This is favored to represent a meningioma. Recommend comparison prior imaging. Aortic Atherosclerosis (ICD10-I70.0). Electronically Signed   By: Lorenza Cambridge M.D.   On: 03/16/2023 15:35   CT Lumbar Spine Wo Contrast  Result Date: 03/16/2023 CLINICAL DATA:  Head trauma, minor (Age >= 65y); Low back pain, trauma; Neck trauma (Age >= 65y); Mid-back pain EXAM: CT HEAD WITHOUT CONTRAST CT CERVICAL SPINE WITHOUT CONTRAST CT THORACIC SPINE WITHOUT CONTRAST CT LUMBAR SPINE WITHOUT CONTRAST TECHNIQUE: Multidetector CT imaging of the head, cervical spine, thoracic spine, lumbar spine was performed following the standard protocol without intravenous contrast. Multiplanar CT image reconstructions of the cervical spine were also generated. RADIATION DOSE REDUCTION: This exam was performed according to the departmental dose-optimization program which includes automated exposure control, adjustment of the mA and/or kV according to patient size and/or use of iterative reconstruction technique. COMPARISON:  None Available. FINDINGS: CT HEAD FINDINGS Brain: No hemorrhage. No hydrocephalus. No extra-axial fluid collection. No CT evidence of an acute cortical infarct. No mass effect. Generalized volume loss without lobar predominance. There is a 0.5 x 1.1 cm extra-axial lesion along the right sigmoid sinus. This is favored to represent a meningioma. Vascular: No hyperdense vessel or unexpected calcification. Skull: Normal. Negative for fracture or focal lesion. Sinuses/Orbits: No middle ear or mastoid effusion. Paranasal sinuses are notable for mild mucosal thickening of the floor of bilateral maxillary sinuses. Bilateral lens replacement. Orbits are otherwise unremarkable. Other: None. CT CERVICAL SPINE FINDINGS Alignment: Grade 1 anterolisthesis of C2 on C3. Trace retrolisthesis of C3 on C4. Skull base  and vertebrae: No acute fracture. No primary bone lesion or focal pathologic process. Soft tissues and spinal canal: No  prevertebral fluid or swelling. No visible canal hematoma. Disc levels: No evidence of high-grade spinal canal stenosis Upper chest: There is a 3 mm solid pulmonary nodule in the right upper lobe (series 5, image 83). Aortic atherosclerotic calcifications. CT THORACIC SPINE FINDINGS Alignment: Rightward curvature of the lumbar spine Vertebrae: No acute fracture or focal pathologic process. Paraspinal and other soft tissues: 3 mm solid pulmonary nodule in the peripheral aspect of the right middle lobe. Coronary artery calcifications. Aortic atherosclerotic calcifications. Status post cholecystectomy Disc levels: No evidence of high-grade spinal canal stenosis. CT LUMBAR SPINE FINDINGS Segmentation: Transitional anatomy with lumbarization of S1. Last well-formed disc space is labeled S1-S2. Alignment: There is rightward curvature of the lumbar spine. Vertebrae: There is an acute superior endplate compression deformity at L5. There is also a nondisplaced fracture through the lateral osteophyte at the inferior endplate of L4 (series 9, image 52) Paraspinal and other soft tissues: There is soft tissue stranding along the left lateral aspect of the L4 vertebral body, favored to be posttraumatic. Disc levels: There are multilevel degenerative disc bulges that results in moderate to severe spinal canal narrowing at L4-L5 and L5-S1. IMPRESSION: 1. No acute intracranial abnormality. 2. No acute fracture or traumatic listhesis of the cervical, thoracic, or lumbar spine. 3. Acute superior endplate compression deformity at L5. 4. Nondisplaced fracture through the lateral osteophyte at the inferior endplate of L4. 5. Multilevel degenerative disc bulges that results in moderate to severe spinal canal narrowing at L4-L5 and L5-S1. 6. There is a 0.5 x 1.1 cm extra-axial lesion along the right sigmoid sinus. This is favored to represent a meningioma. Recommend comparison prior imaging. Aortic Atherosclerosis (ICD10-I70.0).  Electronically Signed   By: Lorenza Cambridge M.D.   On: 03/16/2023 15:35   CT Thoracic Spine Wo Contrast  Result Date: 03/16/2023 CLINICAL DATA:  Head trauma, minor (Age >= 65y); Low back pain, trauma; Neck trauma (Age >= 65y); Mid-back pain EXAM: CT HEAD WITHOUT CONTRAST CT CERVICAL SPINE WITHOUT CONTRAST CT THORACIC SPINE WITHOUT CONTRAST CT LUMBAR SPINE WITHOUT CONTRAST TECHNIQUE: Multidetector CT imaging of the head, cervical spine, thoracic spine, lumbar spine was performed following the standard protocol without intravenous contrast. Multiplanar CT image reconstructions of the cervical spine were also generated. RADIATION DOSE REDUCTION: This exam was performed according to the departmental dose-optimization program which includes automated exposure control, adjustment of the mA and/or kV according to patient size and/or use of iterative reconstruction technique. COMPARISON:  None Available. FINDINGS: CT HEAD FINDINGS Brain: No hemorrhage. No hydrocephalus. No extra-axial fluid collection. No CT evidence of an acute cortical infarct. No mass effect. Generalized volume loss without lobar predominance. There is a 0.5 x 1.1 cm extra-axial lesion along the right sigmoid sinus. This is favored to represent a meningioma. Vascular: No hyperdense vessel or unexpected calcification. Skull: Normal. Negative for fracture or focal lesion. Sinuses/Orbits: No middle ear or mastoid effusion. Paranasal sinuses are notable for mild mucosal thickening of the floor of bilateral maxillary sinuses. Bilateral lens replacement. Orbits are otherwise unremarkable. Other: None. CT CERVICAL SPINE FINDINGS Alignment: Grade 1 anterolisthesis of C2 on C3. Trace retrolisthesis of C3 on C4. Skull base and vertebrae: No acute fracture. No primary bone lesion or focal pathologic process. Soft tissues and spinal canal: No prevertebral fluid or swelling. No visible canal hematoma. Disc levels: No evidence of high-grade spinal canal stenosis  Upper chest: There  is a 3 mm solid pulmonary nodule in the right upper lobe (series 5, image 83). Aortic atherosclerotic calcifications. CT THORACIC SPINE FINDINGS Alignment: Rightward curvature of the lumbar spine Vertebrae: No acute fracture or focal pathologic process. Paraspinal and other soft tissues: 3 mm solid pulmonary nodule in the peripheral aspect of the right middle lobe. Coronary artery calcifications. Aortic atherosclerotic calcifications. Status post cholecystectomy Disc levels: No evidence of high-grade spinal canal stenosis. CT LUMBAR SPINE FINDINGS Segmentation: Transitional anatomy with lumbarization of S1. Last well-formed disc space is labeled S1-S2. Alignment: There is rightward curvature of the lumbar spine. Vertebrae: There is an acute superior endplate compression deformity at L5. There is also a nondisplaced fracture through the lateral osteophyte at the inferior endplate of L4 (series 9, image 52) Paraspinal and other soft tissues: There is soft tissue stranding along the left lateral aspect of the L4 vertebral body, favored to be posttraumatic. Disc levels: There are multilevel degenerative disc bulges that results in moderate to severe spinal canal narrowing at L4-L5 and L5-S1. IMPRESSION: 1. No acute intracranial abnormality. 2. No acute fracture or traumatic listhesis of the cervical, thoracic, or lumbar spine. 3. Acute superior endplate compression deformity at L5. 4. Nondisplaced fracture through the lateral osteophyte at the inferior endplate of L4. 5. Multilevel degenerative disc bulges that results in moderate to severe spinal canal narrowing at L4-L5 and L5-S1. 6. There is a 0.5 x 1.1 cm extra-axial lesion along the right sigmoid sinus. This is favored to represent a meningioma. Recommend comparison prior imaging. Aortic Atherosclerosis (ICD10-I70.0). Electronically Signed   By: Lorenza Cambridge M.D.   On: 03/16/2023 15:35   DG Pelvis 1-2 Views  Result Date: 03/16/2023 CLINICAL  DATA:  Fall.  Lower back pain. EXAM: PELVIS - 1-2 VIEW COMPARISON:  None Available. FINDINGS: Osteopenia. There is no evidence of pelvic fracture or diastasis. The sacroiliac joints and pubic symphysis appear anatomically aligned. The hips are anatomically aligned with degenerative changes. Degenerative changes of the lumbar spine. IMPRESSION: No acute osseous abnormality on AP pelvis radiograph. Electronically Signed   By: Hart Robinsons M.D.   On: 03/16/2023 12:34   DG Chest Portable 1 View  Result Date: 03/16/2023 CLINICAL DATA:  Fall. EXAM: PORTABLE CHEST 1 VIEW COMPARISON:  Chest radiograph dated August 24, 2021. FINDINGS: The heart size and mediastinal contours are within normal limits. Aortic atherosclerosis. Chronic appearing coarse interstitial lung markings. No focal consolidation. No pneumothorax or pleural effusion. Degenerative changes of the thoracic spine and shoulders. No acute osseous abnormality. IMPRESSION: No acute findings in the chest. Electronically Signed   By: Hart Robinsons M.D.   On: 03/16/2023 12:31      Subjective: Patient seen and examined at bedside.  Extremely poor historian and very slow to respond.  Hard of hearing.  No fever, agitation or vomiting reported.   Discharge Exam: Vitals:   03/17/23 2000 03/18/23 0820  BP: (!) 166/61 (!) 132/54  Pulse: 68 64  Resp: 17 20  Temp: 98.3 F (36.8 C) 97.9 F (36.6 C)  SpO2: 100% 96%    General: Currently on room air.  No distress.  Chronically ill and deconditioned looking.  Hard of hearing.  Extremely slow to respond.  Poor historian.  Flat affect. respiratory: Decreased breath sounds at bases bilaterally with some crackles CVS: Currently rate controlled; S1-S2 heard  abdominal: Soft, nontender, slightly distended, no organomegaly; bowel sounds are heard  extremities: Trace lower extremity edema; no clubbing.    The results of significant diagnostics  from this hospitalization (including imaging, microbiology,  ancillary and laboratory) are listed below for reference.     Microbiology: No results found for this or any previous visit (from the past 240 hour(s)).   Labs: BNP (last 3 results) Recent Labs    03/17/23 0813  BNP 230.2*   Basic Metabolic Panel: Recent Labs  Lab 03/16/23 1113 03/17/23 0813  NA 130* 130*  K 3.4* 4.9  CL 95* 96*  CO2 25 22  GLUCOSE 96 78  BUN 20 20  CREATININE 0.84 0.87  CALCIUM 9.0 9.5   Liver Function Tests: Recent Labs  Lab 03/17/23 0813  AST 24  ALT 15  ALKPHOS 68  BILITOT 1.0  PROT 6.4*  ALBUMIN 3.3*   No results for input(s): "LIPASE", "AMYLASE" in the last 168 hours. No results for input(s): "AMMONIA" in the last 168 hours. CBC: Recent Labs  Lab 03/16/23 1113 03/17/23 0813  WBC 8.3 8.4  NEUTROABS 6.3  --   HGB 10.7* 11.8*  HCT 33.0* 36.2  MCV 88.0 89.4  PLT 217 253   Cardiac Enzymes: Recent Labs  Lab 03/16/23 1113  CKTOTAL 261*   BNP: Invalid input(s): "POCBNP" CBG: No results for input(s): "GLUCAP" in the last 168 hours. D-Dimer No results for input(s): "DDIMER" in the last 72 hours. Hgb A1c No results for input(s): "HGBA1C" in the last 72 hours. Lipid Profile No results for input(s): "CHOL", "HDL", "LDLCALC", "TRIG", "CHOLHDL", "LDLDIRECT" in the last 72 hours. Thyroid function studies Recent Labs    03/17/23 0813  TSH 3.741   Anemia work up No results for input(s): "VITAMINB12", "FOLATE", "FERRITIN", "TIBC", "IRON", "RETICCTPCT" in the last 72 hours. Urinalysis    Component Value Date/Time   COLORURINE YELLOW 08/24/2021 2349   APPEARANCEUR CLEAR 08/24/2021 2349   LABSPEC 1.010 08/24/2021 2349   PHURINE 6.0 08/24/2021 2349   GLUCOSEU NEGATIVE 08/24/2021 2349   HGBUR TRACE (A) 08/24/2021 2349   BILIRUBINUR NEGATIVE 08/24/2021 2349   KETONESUR NEGATIVE 08/24/2021 2349   PROTEINUR NEGATIVE 08/24/2021 2349   NITRITE NEGATIVE 08/24/2021 2349   LEUKOCYTESUR SMALL (A) 08/24/2021 2349   Sepsis Labs Recent  Labs  Lab 03/16/23 1113 03/17/23 0813  WBC 8.3 8.4   Microbiology No results found for this or any previous visit (from the past 240 hour(s)).   Time coordinating discharge: 35 minutes  SIGNED:   Glade Lloyd, MD  [facility] 03/18/2023, 11:52 AM

## 2023-03-18 NOTE — Progress Notes (Signed)
Physical Therapy Treatment Patient Details Name: Erika Nguyen MRN: [id number] DOB: 1926/11/18 Today's Date: 03/18/2023   History of Present Illness Patient is a 89 year old female with ground level fall and was down for several hours. Found to have L4 and L5 compression deformities, fitted with a TLSO brace. Unable to ambulate in ED with LLE weakness which family reports has been ongoing for several weeks.    PT Comments  Pt received in bed, very tearful reporting high pain level. Pt voicing need to use the bathroom. Bed saturated with urine. Pt assisted to [facility] then to bedside recliner. Increased assist for mobility as compared to eval yesterday. Min assist rolling, mod assist sidelying to sit, mod assist sit to stand, and mod assist pivot transfer. Pt declining TLSO. Pt repeatedly stating that she just wants to die. Pt positioned in recliner with feet elevated and set up for breakfast.  Continue to recommend further therapy upon d/c, < 3 hours/day.     If plan is discharge home, recommend the following: A lot of help with walking and/or transfers;A lot of help with bathing/dressing/bathroom;Assistance with cooking/housework;Assist for transportation;Help with stairs or ramp for entrance   Can travel by private vehicle     Yes  Equipment Recommendations  Rolling walker (2 wheels);Wheelchair (measurements PT);Wheelchair cushion (measurements PT)    Recommendations for Other Services       Precautions / Restrictions Precautions Precautions: Fall;Back Spinal Brace: Thoracolumbosacral orthotic Restrictions Other Position/Activity Restrictions: Pt refusing TLSO.     Mobility  Bed Mobility Overal bed mobility: Needs Assistance Bed Mobility: Rolling, Sidelying to Sit Rolling: Min assist Sidelying to sit: Mod assist, Used rails       General bed mobility comments: assist with BLE and trunk    Transfers Overall transfer level: Needs assistance Equipment used: Rolling walker (2  wheels) Transfers: Sit to/from Stand, Bed to chair/wheelchair/BSC Sit to Stand: Mod assist   Step pivot transfers: Mod assist Squat pivot transfers: Mod assist     General transfer comment: mod assist squat pivot transfer bed to [facility]. Sit to stand with RW from [facility] for pericare. Mod assist step pivot transfer with RW BSC to recliner.    Ambulation/Gait                   Stairs             Wheelchair Mobility     Tilt Bed    Modified Rankin (Stroke Patients Only)       Balance Overall balance assessment: Needs assistance Sitting-balance support: Feet supported, Bilateral upper extremity supported Sitting balance-Leahy Scale: Fair     Standing balance support: Bilateral upper extremity supported, During functional activity, Reliant on assistive device for balance Standing balance-Leahy Scale: Poor                              Cognition Arousal: Alert Behavior During Therapy: Lability, Anxious Overall Cognitive Status: No family/caregiver present to determine baseline cognitive functioning                                 General Comments: Pt repeatedly stating that she doesn't want to live. "I just want to die. Can the doctor just give me something so I don't wake up?"        Exercises      General Comments  Pertinent Vitals/Pain Pain Assessment Pain Assessment: Faces Faces Pain Scale: Hurts whole lot Pain Location: back, LLE Pain Descriptors / Indicators: Grimacing, Guarding, Moaning Pain Intervention(s): Limited activity within patient's tolerance, Monitored during session, Repositioned, Patient requesting pain meds-RN notified    Home Living                          Prior Function            PT Goals (current goals can now be found in the care plan section) Acute Rehab PT Goals Patient Stated Goal: not stated Progress towards PT goals: Not progressing toward goals - comment (pain)     Frequency    Min 1X/week      PT Plan      Co-evaluation              AM-PAC PT "6 Clicks" Mobility   Outcome Measure  Help needed turning from your back to your side while in a flat bed without using bedrails?: A Little Help needed moving from lying on your back to sitting on the side of a flat bed without using bedrails?: A Lot Help needed moving to and from a bed to a chair (including a wheelchair)?: A Lot Help needed standing up from a chair using your arms (e.g., wheelchair or bedside chair)?: A Lot Help needed to walk in hospital room?: Total Help needed climbing 3-5 steps with a railing? : Total 6 Click Score: 11    End of Session Equipment Utilized During Treatment: Gait belt Activity Tolerance: Patient limited by pain Patient left: in chair;with call bell/phone within reach;with chair alarm set Nurse Communication: Patient requests pain meds;Mobility status PT Visit Diagnosis: Unsteadiness on feet (R26.81);Muscle weakness (generalized) (M62.81)     Time: 8119-1478 PT Time Calculation (min) (ACUTE ONLY): 24 min  Charges:    $Therapeutic Activity: 23-37 mins PT General Charges $$ ACUTE PT VISIT: 1 Visit                     Ferd Glassing., PT  Office # [phone]    Ilda Foil 03/18/2023, 9:34 AM

## 2023-03-18 NOTE — TOC Progression Note (Addendum)
Transition of Care [facility]) - Progression Note    Patient Details  Name: Erika Nguyen MRN: [id number] Date of Birth: 09/25/1926  [facility] CM/SW Contact  Lorri Frederick, LCSW Phone Number: 03/18/2023, 9:48 AM  Clinical Narrative:   CSW spoke with pt sister Glenda by phone.  [phone].  Discussed bed offers and she would like to accept offer at [facility].  She will be at the hospital shortly.    SNF auth request submitted in Navi and approved: [id number]: 5 days: 10/31-11/4.    Awaiting confirmation that [facility] can receive pt today.   1000: Per Kelly/[facility], no bed available until Saturday.  Semi private room.  1115:CSw spoke with pt, Clayborn Heron in room, discussed no beds at [facility] and they wanted to change facility to [facility].  CSW confirmed with Nikki/[facility] that they can receive pt today.    TC Navi, they can change the facility, will issue new auth number  CSW spoke with Rivka Barbara and [place] regarding transportation--they can provide transport, will need assistance loading and unloading pt.   1300: New auth received: [id number], 5 days: 10/31-11/4  Expected Discharge Plan: Skilled Nursing Facility Barriers to Discharge: Continued Medical Work up, SNF Pending bed offer  Expected Discharge Plan and Services In-house Referral: Clinical Social Work   Post Acute Care Choice: Skilled Nursing Facility Living arrangements for the past 2 months: Single Family Home                                       Social Determinants of Health (SDOH) Interventions SDOH Screenings   Food Insecurity: No Food Insecurity (03/16/2023)  Housing: Low Risk  (03/16/2023)  Transportation Needs: No Transportation Needs (03/16/2023)  Utilities: Not At Risk (03/16/2023)  Social Connections: Unknown (09/30/2021)   Received from [facility]  Tobacco Use: Low Risk  (03/17/2023)  Recent Concern: Tobacco Use - Medium Risk (01/07/2023)   Received from [facility]    Readmission Risk Interventions     No data to display

## 2023-03-18 NOTE — TOC Transition Note (Signed)
Transition of Care [facility]) - CM/SW Discharge Note   Patient Details  Name: Erika Nguyen MRN: [id number] Date of Birth: Aug 30, 1926  Transition of [facility]) CM/SW Contact:  Lorri Frederick, LCSW Phone Number: 03/18/2023, 1:07 PM   Clinical Narrative:  Pt discharging to [facility].  RN call report to [phone].  Sister Rivka Barbara, daughter Jill Side will transport pt and will need pt brought down to main north tower entrance with help getting into the vehicle.      Final next level of care: Skilled Nursing Facility Barriers to Discharge: Barriers Resolved   Patient Goals and CMS Choice CMS Medicare.gov Compare Post Acute Care list provided to:: Patient Represenative (must comment) (sister Rivka Barbara) Choice offered to / list presented to : Sibling  Discharge Placement                Patient chooses bed at: [facility] Patient to be transferred to facility by: sister Rivka Barbara, daughter Jill Side Name of family member notified: Rivka Barbara and colleen in room Patient and family notified of of transfer: 03/18/23  Discharge Plan and Services Additional resources added to the After Visit Summary for   In-house Referral: Clinical Social Work   Post Acute Care Choice: Skilled Nursing Facility                               Social Determinants of Health (SDOH) Interventions SDOH Screenings   Food Insecurity: No Food Insecurity (03/16/2023)  Housing: Low Risk  (03/16/2023)  Transportation Needs: No Transportation Needs (03/16/2023)  Utilities: Not At Risk (03/16/2023)  Social Connections: Unknown (09/30/2021)   Received from [facility]  Tobacco Use: Low Risk  (03/17/2023)  Recent Concern: Tobacco Use - Medium Risk (01/07/2023)   Received from [facility]     Readmission Risk Interventions     No data to display

## 2023-03-18 NOTE — Progress Notes (Signed)
PROGRESS NOTE    Erika Nguyen  HYQ:[id number] DOB: 10/30/1926 DOA: 03/16/2023 PCP: Roger Kill, PA-C   Brief Narrative:  89 y.o. female who lives independently, with medical history significant of hypertension, hyperlipidemia, hypothyroidism presented after a ground-level fall at home and was on the floor for several hours without loss of consciousness.  On presentation, CT scanning of entire spine as well as head showed L5 superior endplate compression deformity, nondisplaced fracture through the lateral osteophyte at the inferior endplate of L4, as well as multilevel degenerative disc bulges with moderate to severe spinal canal narrowing L4-L5 and L5-S1 with incidental meningioma.  She was fitted with TLSO brace but continued to have left leg weakness as well as severe back pain.  PT recommended SNF placement.  TOC consulted.  Assessment & Plan:   Ground-level fall Left lower extremity weakness Lumbar spine fracture/compression deformities Moderate to severe lumbar spinal canal stenosis -Presented with ground-level fall and left lower extremity weakness.  Imaging as above.  Continue TLSO brace. -Continue pain management.  Fall precautions.  Doubt that the patient will be a candidate for any kind of neurosurgical intervention -MRI of brain did not show any acute stroke - PT recommended SNF placement.  TOC consulted.  Essential hypertension Hyperlipidemia -Monitor blood pressure.  Continue benazepril and hydralazine -Continue statin  Hypothyroidism -Continue Synthroid  Dementia -Possibly has mild dementia.  Will need outpatient palliative care evaluation and follow-up.  Continue sertraline  Chronic lower extremity swelling -Outpatient follow-up.  Hydrochlorothiazide on hold  Left hip pain -Due to fall.  Plain x-rays without acute fracture.  PT eval.  Hyponatremia -Mild.  Encourage oral intake.  Hydrochlorothiazide on hold.  Labs pending  today.  Hypokalemia -Resolved.    DVT prophylaxis: Lovenox Code Status: DNR Family Communication: None at bedside Disposition Plan: Status is: Observation The patient will require care spanning > 2 midnights and should be moved to inpatient because: Of severity of illness.  Need for SNF placement    Consultants: None  Procedures: None  Antimicrobials: None   Subjective: Patient seen and examined at bedside.  Extremely poor historian and very slow to respond.  Hard of hearing.  No fever, agitation or vomiting reported. Objective: Vitals:   03/17/23 0415 03/17/23 0739 03/17/23 1418 03/17/23 2000  BP: (!) 104/53 (!) 154/60 (!) 94/46 (!) 166/61  Pulse: 69 61 69 68  Resp:   18 17  Temp: 98.1 F (36.7 C) 97.8 F (36.6 C) 98.4 F (36.9 C) 98.3 F (36.8 C)  TempSrc: Oral Oral Oral Oral  SpO2: 95% 100% 100% 100%   No intake or output data in the 24 hours ending 03/18/23 0815  There were no vitals filed for this visit.  Examination:  General: Currently on room air.  No distress.  Chronically ill and deconditioned looking.  Hard of hearing.  Extremely slow to respond.  Poor historian.  Flat affect. respiratory: Decreased breath sounds at bases bilaterally with some crackles CVS: Currently rate controlled; S1-S2 heard  abdominal: Soft, nontender, slightly distended, no organomegaly; bowel sounds are heard  extremities: Trace lower extremity edema; no clubbing.       Data Reviewed: I have personally reviewed following labs and imaging studies  CBC: Recent Labs  Lab 03/16/23 1113 03/17/23 0813  WBC 8.3 8.4  NEUTROABS 6.3  --   HGB 10.7* 11.8*  HCT 33.0* 36.2  MCV 88.0 89.4  PLT 217 253   Basic Metabolic Panel: Recent Labs  Lab 03/16/23 1113 03/17/23 0813  NA 130* 130*  K 3.4* 4.9  CL 95* 96*  CO2 25 22  GLUCOSE 96 78  BUN 20 20  CREATININE 0.84 0.87  CALCIUM 9.0 9.5   GFR: CrCl cannot be calculated (Unknown ideal weight.). Liver Function  Tests: Recent Labs  Lab 03/17/23 0813  AST 24  ALT 15  ALKPHOS 68  BILITOT 1.0  PROT 6.4*  ALBUMIN 3.3*   No results for input(s): "LIPASE", "AMYLASE" in the last 168 hours. No results for input(s): "AMMONIA" in the last 168 hours. Coagulation Profile: No results for input(s): "INR", "PROTIME" in the last 168 hours. Cardiac Enzymes: Recent Labs  Lab 03/16/23 1113  CKTOTAL 261*   BNP (last 3 results) No results for input(s): "PROBNP" in the last 8760 hours. HbA1C: No results for input(s): "HGBA1C" in the last 72 hours. CBG: No results for input(s): "GLUCAP" in the last 168 hours. Lipid Profile: No results for input(s): "CHOL", "HDL", "LDLCALC", "TRIG", "CHOLHDL", "LDLDIRECT" in the last 72 hours. Thyroid Function Tests: Recent Labs    03/17/23 0813  TSH 3.741   Anemia Panel: No results for input(s): "VITAMINB12", "FOLATE", "FERRITIN", "TIBC", "IRON", "RETICCTPCT" in the last 72 hours. Sepsis Labs: No results for input(s): "PROCALCITON", "LATICACIDVEN" in the last 168 hours.  No results found for this or any previous visit (from the past 240 hour(s)).       Radiology Studies: MR BRAIN WO CONTRAST  Result Date: 03/16/2023 CLINICAL DATA:  Altered mental status EXAM: MRI HEAD WITHOUT CONTRAST TECHNIQUE: Multiplanar, multiecho pulse sequences of the brain and surrounding structures were obtained without intravenous contrast. COMPARISON:  None Available. FINDINGS: Brain: No acute infarct, mass effect or extra-axial collection. No acute or chronic hemorrhage. There is multifocal hyperintense T2-weighted signal within the white matter. Parenchymal volume and CSF spaces are normal. Extra-axial mass in the right retromastoid region, measuring 1.5 cm. Midline structures are normal. Vascular: Normal flow voids Skull and upper cervical spine: Normal calvarium and skull base. Visualized upper cervical spine and soft tissues are normal. Sinuses/Orbits:Paranasal sinuses are clear. No  mastoid effusion. Normal orbits. Ocular lens replacements. IMPRESSION: 1. No acute intracranial abnormality. 2. Chronic small vessel ischemia. 3. Extra-axial mass in the right retromastoid region, measuring 1.5 cm, most consistent with a meningioma. Electronically Signed   By: Deatra Robinson M.D.   On: 03/16/2023 22:47   CT Head Wo Contrast  Result Date: 03/16/2023 CLINICAL DATA:  Head trauma, minor (Age >= 65y); Low back pain, trauma; Neck trauma (Age >= 65y); Mid-back pain EXAM: CT HEAD WITHOUT CONTRAST CT CERVICAL SPINE WITHOUT CONTRAST CT THORACIC SPINE WITHOUT CONTRAST CT LUMBAR SPINE WITHOUT CONTRAST TECHNIQUE: Multidetector CT imaging of the head, cervical spine, thoracic spine, lumbar spine was performed following the standard protocol without intravenous contrast. Multiplanar CT image reconstructions of the cervical spine were also generated. RADIATION DOSE REDUCTION: This exam was performed according to the departmental dose-optimization program which includes automated exposure control, adjustment of the mA and/or kV according to patient size and/or use of iterative reconstruction technique. COMPARISON:  None Available. FINDINGS: CT HEAD FINDINGS Brain: No hemorrhage. No hydrocephalus. No extra-axial fluid collection. No CT evidence of an acute cortical infarct. No mass effect. Generalized volume loss without lobar predominance. There is a 0.5 x 1.1 cm extra-axial lesion along the right sigmoid sinus. This is favored to represent a meningioma. Vascular: No hyperdense vessel or unexpected calcification. Skull: Normal. Negative for fracture or focal lesion. Sinuses/Orbits: No middle ear or mastoid effusion. Paranasal sinuses are notable for  mild mucosal thickening of the floor of bilateral maxillary sinuses. Bilateral lens replacement. Orbits are otherwise unremarkable. Other: None. CT CERVICAL SPINE FINDINGS Alignment: Grade 1 anterolisthesis of C2 on C3. Trace retrolisthesis of C3 on C4. Skull base  and vertebrae: No acute fracture. No primary bone lesion or focal pathologic process. Soft tissues and spinal canal: No prevertebral fluid or swelling. No visible canal hematoma. Disc levels: No evidence of high-grade spinal canal stenosis Upper chest: There is a 3 mm solid pulmonary nodule in the right upper lobe (series 5, image 83). Aortic atherosclerotic calcifications. CT THORACIC SPINE FINDINGS Alignment: Rightward curvature of the lumbar spine Vertebrae: No acute fracture or focal pathologic process. Paraspinal and other soft tissues: 3 mm solid pulmonary nodule in the peripheral aspect of the right middle lobe. Coronary artery calcifications. Aortic atherosclerotic calcifications. Status post cholecystectomy Disc levels: No evidence of high-grade spinal canal stenosis. CT LUMBAR SPINE FINDINGS Segmentation: Transitional anatomy with lumbarization of S1. Last well-formed disc space is labeled S1-S2. Alignment: There is rightward curvature of the lumbar spine. Vertebrae: There is an acute superior endplate compression deformity at L5. There is also a nondisplaced fracture through the lateral osteophyte at the inferior endplate of L4 (series 9, image 52) Paraspinal and other soft tissues: There is soft tissue stranding along the left lateral aspect of the L4 vertebral body, favored to be posttraumatic. Disc levels: There are multilevel degenerative disc bulges that results in moderate to severe spinal canal narrowing at L4-L5 and L5-S1. IMPRESSION: 1. No acute intracranial abnormality. 2. No acute fracture or traumatic listhesis of the cervical, thoracic, or lumbar spine. 3. Acute superior endplate compression deformity at L5. 4. Nondisplaced fracture through the lateral osteophyte at the inferior endplate of L4. 5. Multilevel degenerative disc bulges that results in moderate to severe spinal canal narrowing at L4-L5 and L5-S1. 6. There is a 0.5 x 1.1 cm extra-axial lesion along the right sigmoid sinus. This is  favored to represent a meningioma. Recommend comparison prior imaging. Aortic Atherosclerosis (ICD10-I70.0). Electronically Signed   By: Lorenza Cambridge M.D.   On: 03/16/2023 15:35   CT Cervical Spine Wo Contrast  Result Date: 03/16/2023 CLINICAL DATA:  Head trauma, minor (Age >= 65y); Low back pain, trauma; Neck trauma (Age >= 65y); Mid-back pain EXAM: CT HEAD WITHOUT CONTRAST CT CERVICAL SPINE WITHOUT CONTRAST CT THORACIC SPINE WITHOUT CONTRAST CT LUMBAR SPINE WITHOUT CONTRAST TECHNIQUE: Multidetector CT imaging of the head, cervical spine, thoracic spine, lumbar spine was performed following the standard protocol without intravenous contrast. Multiplanar CT image reconstructions of the cervical spine were also generated. RADIATION DOSE REDUCTION: This exam was performed according to the departmental dose-optimization program which includes automated exposure control, adjustment of the mA and/or kV according to patient size and/or use of iterative reconstruction technique. COMPARISON:  None Available. FINDINGS: CT HEAD FINDINGS Brain: No hemorrhage. No hydrocephalus. No extra-axial fluid collection. No CT evidence of an acute cortical infarct. No mass effect. Generalized volume loss without lobar predominance. There is a 0.5 x 1.1 cm extra-axial lesion along the right sigmoid sinus. This is favored to represent a meningioma. Vascular: No hyperdense vessel or unexpected calcification. Skull: Normal. Negative for fracture or focal lesion. Sinuses/Orbits: No middle ear or mastoid effusion. Paranasal sinuses are notable for mild mucosal thickening of the floor of bilateral maxillary sinuses. Bilateral lens replacement. Orbits are otherwise unremarkable. Other: None. CT CERVICAL SPINE FINDINGS Alignment: Grade 1 anterolisthesis of C2 on C3. Trace retrolisthesis of C3 on C4. Skull base and  vertebrae: No acute fracture. No primary bone lesion or focal pathologic process. Soft tissues and spinal canal: No prevertebral  fluid or swelling. No visible canal hematoma. Disc levels: No evidence of high-grade spinal canal stenosis Upper chest: There is a 3 mm solid pulmonary nodule in the right upper lobe (series 5, image 83). Aortic atherosclerotic calcifications. CT THORACIC SPINE FINDINGS Alignment: Rightward curvature of the lumbar spine Vertebrae: No acute fracture or focal pathologic process. Paraspinal and other soft tissues: 3 mm solid pulmonary nodule in the peripheral aspect of the right middle lobe. Coronary artery calcifications. Aortic atherosclerotic calcifications. Status post cholecystectomy Disc levels: No evidence of high-grade spinal canal stenosis. CT LUMBAR SPINE FINDINGS Segmentation: Transitional anatomy with lumbarization of S1. Last well-formed disc space is labeled S1-S2. Alignment: There is rightward curvature of the lumbar spine. Vertebrae: There is an acute superior endplate compression deformity at L5. There is also a nondisplaced fracture through the lateral osteophyte at the inferior endplate of L4 (series 9, image 52) Paraspinal and other soft tissues: There is soft tissue stranding along the left lateral aspect of the L4 vertebral body, favored to be posttraumatic. Disc levels: There are multilevel degenerative disc bulges that results in moderate to severe spinal canal narrowing at L4-L5 and L5-S1. IMPRESSION: 1. No acute intracranial abnormality. 2. No acute fracture or traumatic listhesis of the cervical, thoracic, or lumbar spine. 3. Acute superior endplate compression deformity at L5. 4. Nondisplaced fracture through the lateral osteophyte at the inferior endplate of L4. 5. Multilevel degenerative disc bulges that results in moderate to severe spinal canal narrowing at L4-L5 and L5-S1. 6. There is a 0.5 x 1.1 cm extra-axial lesion along the right sigmoid sinus. This is favored to represent a meningioma. Recommend comparison prior imaging. Aortic Atherosclerosis (ICD10-I70.0). Electronically Signed    By: Lorenza Cambridge M.D.   On: 03/16/2023 15:35   CT Lumbar Spine Wo Contrast  Result Date: 03/16/2023 CLINICAL DATA:  Head trauma, minor (Age >= 65y); Low back pain, trauma; Neck trauma (Age >= 65y); Mid-back pain EXAM: CT HEAD WITHOUT CONTRAST CT CERVICAL SPINE WITHOUT CONTRAST CT THORACIC SPINE WITHOUT CONTRAST CT LUMBAR SPINE WITHOUT CONTRAST TECHNIQUE: Multidetector CT imaging of the head, cervical spine, thoracic spine, lumbar spine was performed following the standard protocol without intravenous contrast. Multiplanar CT image reconstructions of the cervical spine were also generated. RADIATION DOSE REDUCTION: This exam was performed according to the departmental dose-optimization program which includes automated exposure control, adjustment of the mA and/or kV according to patient size and/or use of iterative reconstruction technique. COMPARISON:  None Available. FINDINGS: CT HEAD FINDINGS Brain: No hemorrhage. No hydrocephalus. No extra-axial fluid collection. No CT evidence of an acute cortical infarct. No mass effect. Generalized volume loss without lobar predominance. There is a 0.5 x 1.1 cm extra-axial lesion along the right sigmoid sinus. This is favored to represent a meningioma. Vascular: No hyperdense vessel or unexpected calcification. Skull: Normal. Negative for fracture or focal lesion. Sinuses/Orbits: No middle ear or mastoid effusion. Paranasal sinuses are notable for mild mucosal thickening of the floor of bilateral maxillary sinuses. Bilateral lens replacement. Orbits are otherwise unremarkable. Other: None. CT CERVICAL SPINE FINDINGS Alignment: Grade 1 anterolisthesis of C2 on C3. Trace retrolisthesis of C3 on C4. Skull base and vertebrae: No acute fracture. No primary bone lesion or focal pathologic process. Soft tissues and spinal canal: No prevertebral fluid or swelling. No visible canal hematoma. Disc levels: No evidence of high-grade spinal canal stenosis Upper chest: There is a  3 mm  solid pulmonary nodule in the right upper lobe (series 5, image 83). Aortic atherosclerotic calcifications. CT THORACIC SPINE FINDINGS Alignment: Rightward curvature of the lumbar spine Vertebrae: No acute fracture or focal pathologic process. Paraspinal and other soft tissues: 3 mm solid pulmonary nodule in the peripheral aspect of the right middle lobe. Coronary artery calcifications. Aortic atherosclerotic calcifications. Status post cholecystectomy Disc levels: No evidence of high-grade spinal canal stenosis. CT LUMBAR SPINE FINDINGS Segmentation: Transitional anatomy with lumbarization of S1. Last well-formed disc space is labeled S1-S2. Alignment: There is rightward curvature of the lumbar spine. Vertebrae: There is an acute superior endplate compression deformity at L5. There is also a nondisplaced fracture through the lateral osteophyte at the inferior endplate of L4 (series 9, image 52) Paraspinal and other soft tissues: There is soft tissue stranding along the left lateral aspect of the L4 vertebral body, favored to be posttraumatic. Disc levels: There are multilevel degenerative disc bulges that results in moderate to severe spinal canal narrowing at L4-L5 and L5-S1. IMPRESSION: 1. No acute intracranial abnormality. 2. No acute fracture or traumatic listhesis of the cervical, thoracic, or lumbar spine. 3. Acute superior endplate compression deformity at L5. 4. Nondisplaced fracture through the lateral osteophyte at the inferior endplate of L4. 5. Multilevel degenerative disc bulges that results in moderate to severe spinal canal narrowing at L4-L5 and L5-S1. 6. There is a 0.5 x 1.1 cm extra-axial lesion along the right sigmoid sinus. This is favored to represent a meningioma. Recommend comparison prior imaging. Aortic Atherosclerosis (ICD10-I70.0). Electronically Signed   By: Lorenza Cambridge M.D.   On: 03/16/2023 15:35   CT Thoracic Spine Wo Contrast  Result Date: 03/16/2023 CLINICAL DATA:  Head  trauma, minor (Age >= 65y); Low back pain, trauma; Neck trauma (Age >= 65y); Mid-back pain EXAM: CT HEAD WITHOUT CONTRAST CT CERVICAL SPINE WITHOUT CONTRAST CT THORACIC SPINE WITHOUT CONTRAST CT LUMBAR SPINE WITHOUT CONTRAST TECHNIQUE: Multidetector CT imaging of the head, cervical spine, thoracic spine, lumbar spine was performed following the standard protocol without intravenous contrast. Multiplanar CT image reconstructions of the cervical spine were also generated. RADIATION DOSE REDUCTION: This exam was performed according to the departmental dose-optimization program which includes automated exposure control, adjustment of the mA and/or kV according to patient size and/or use of iterative reconstruction technique. COMPARISON:  None Available. FINDINGS: CT HEAD FINDINGS Brain: No hemorrhage. No hydrocephalus. No extra-axial fluid collection. No CT evidence of an acute cortical infarct. No mass effect. Generalized volume loss without lobar predominance. There is a 0.5 x 1.1 cm extra-axial lesion along the right sigmoid sinus. This is favored to represent a meningioma. Vascular: No hyperdense vessel or unexpected calcification. Skull: Normal. Negative for fracture or focal lesion. Sinuses/Orbits: No middle ear or mastoid effusion. Paranasal sinuses are notable for mild mucosal thickening of the floor of bilateral maxillary sinuses. Bilateral lens replacement. Orbits are otherwise unremarkable. Other: None. CT CERVICAL SPINE FINDINGS Alignment: Grade 1 anterolisthesis of C2 on C3. Trace retrolisthesis of C3 on C4. Skull base and vertebrae: No acute fracture. No primary bone lesion or focal pathologic process. Soft tissues and spinal canal: No prevertebral fluid or swelling. No visible canal hematoma. Disc levels: No evidence of high-grade spinal canal stenosis Upper chest: There is a 3 mm solid pulmonary nodule in the right upper lobe (series 5, image 83). Aortic atherosclerotic calcifications. CT THORACIC SPINE  FINDINGS Alignment: Rightward curvature of the lumbar spine Vertebrae: No acute fracture or focal pathologic process. Paraspinal and other  soft tissues: 3 mm solid pulmonary nodule in the peripheral aspect of the right middle lobe. Coronary artery calcifications. Aortic atherosclerotic calcifications. Status post cholecystectomy Disc levels: No evidence of high-grade spinal canal stenosis. CT LUMBAR SPINE FINDINGS Segmentation: Transitional anatomy with lumbarization of S1. Last well-formed disc space is labeled S1-S2. Alignment: There is rightward curvature of the lumbar spine. Vertebrae: There is an acute superior endplate compression deformity at L5. There is also a nondisplaced fracture through the lateral osteophyte at the inferior endplate of L4 (series 9, image 52) Paraspinal and other soft tissues: There is soft tissue stranding along the left lateral aspect of the L4 vertebral body, favored to be posttraumatic. Disc levels: There are multilevel degenerative disc bulges that results in moderate to severe spinal canal narrowing at L4-L5 and L5-S1. IMPRESSION: 1. No acute intracranial abnormality. 2. No acute fracture or traumatic listhesis of the cervical, thoracic, or lumbar spine. 3. Acute superior endplate compression deformity at L5. 4. Nondisplaced fracture through the lateral osteophyte at the inferior endplate of L4. 5. Multilevel degenerative disc bulges that results in moderate to severe spinal canal narrowing at L4-L5 and L5-S1. 6. There is a 0.5 x 1.1 cm extra-axial lesion along the right sigmoid sinus. This is favored to represent a meningioma. Recommend comparison prior imaging. Aortic Atherosclerosis (ICD10-I70.0). Electronically Signed   By: Lorenza Cambridge M.D.   On: 03/16/2023 15:35   DG Pelvis 1-2 Views  Result Date: 03/16/2023 CLINICAL DATA:  Fall.  Lower back pain. EXAM: PELVIS - 1-2 VIEW COMPARISON:  None Available. FINDINGS: Osteopenia. There is no evidence of pelvic fracture or  diastasis. The sacroiliac joints and pubic symphysis appear anatomically aligned. The hips are anatomically aligned with degenerative changes. Degenerative changes of the lumbar spine. IMPRESSION: No acute osseous abnormality on AP pelvis radiograph. Electronically Signed   By: Hart Robinsons M.D.   On: 03/16/2023 12:34   DG Chest Portable 1 View  Result Date: 03/16/2023 CLINICAL DATA:  Fall. EXAM: PORTABLE CHEST 1 VIEW COMPARISON:  Chest radiograph dated August 24, 2021. FINDINGS: The heart size and mediastinal contours are within normal limits. Aortic atherosclerosis. Chronic appearing coarse interstitial lung markings. No focal consolidation. No pneumothorax or pleural effusion. Degenerative changes of the thoracic spine and shoulders. No acute osseous abnormality. IMPRESSION: No acute findings in the chest. Electronically Signed   By: Hart Robinsons M.D.   On: 03/16/2023 12:31        Scheduled Meds:  aspirin EC  81 mg Oral Daily   benazepril  40 mg Oral q AM   cyanocobalamin  2,500 mcg Oral Daily   enoxaparin (LOVENOX) injection  40 mg Subcutaneous Q24H   ferrous sulfate  325 mg Oral Q breakfast   levothyroxine  100 mcg Oral QAC breakfast   pravastatin  40 mg Oral q AM   sertraline  25 mg Oral QPM   sodium chloride flush  3 mL Intravenous Q12H   Continuous Infusions:          Glade Lloyd, MD [facility] 03/18/2023, 8:15 AM

## 2023-06-06 ENCOUNTER — Emergency Department (HOSPITAL_COMMUNITY): Payer: Medicare Other

## 2023-06-06 ENCOUNTER — Other Ambulatory Visit: Payer: Self-pay

## 2023-06-06 ENCOUNTER — Emergency Department (HOSPITAL_COMMUNITY)
Admission: EM | Admit: 2023-06-06 | Discharge: 2023-06-06 | Disposition: A | Discharge status: Home / Self Care | Payer: Medicare Other | Attending: Emergency Medicine | Admitting: Emergency Medicine

## 2023-06-06 DIAGNOSIS — U071 COVID-19: Secondary | ICD-10-CM | POA: Diagnosis not present

## 2023-06-06 DIAGNOSIS — Z7982 Long term (current) use of aspirin: Secondary | ICD-10-CM | POA: Insufficient documentation

## 2023-06-06 DIAGNOSIS — Z79899 Other long term (current) drug therapy: Secondary | ICD-10-CM | POA: Diagnosis not present

## 2023-06-06 DIAGNOSIS — I10 Essential (primary) hypertension: Secondary | ICD-10-CM | POA: Insufficient documentation

## 2023-06-06 DIAGNOSIS — R059 Cough, unspecified: Secondary | ICD-10-CM | POA: Diagnosis present

## 2023-06-06 LAB — RESP PANEL BY RT-PCR (RSV, FLU A&B, COVID)  RVPGX2
Influenza A by PCR: NEGATIVE
Influenza B by PCR: NEGATIVE
Resp Syncytial Virus by PCR: NEGATIVE
SARS Coronavirus 2 by RT PCR: POSITIVE — AB

## 2023-06-06 LAB — GROUP A STREP BY PCR: Group A Strep by PCR: NOT DETECTED

## 2023-06-06 MED ORDER — NIRMATRELVIR&RITONAVIR 300/100 20 X 150 MG & 10 X 100MG PO TBPK
3.0000 | ORAL_TABLET | Freq: Two times a day (BID) | ORAL | Status: DC
  Administered 2023-06-06: 3 via ORAL
  Filled 2023-06-06: qty 3

## 2023-06-06 NOTE — Discharge Instructions (Addendum)
Follow up with your doctor next week for recheck.  Return sooner problems

## 2023-06-06 NOTE — ED Triage Notes (Signed)
Pt BIBA from [facility]. C/o sore throat and cough for several days. EMS reports pt 85 on RA on arrival, placed on 4l [place]. Pt satting 96 on RA during triage.  Aox3- baseline

## 2023-06-06 NOTE — ED Provider Notes (Signed)
[place] EMERGENCY DEPARTMENT AT [facility] Provider Note   CSN: [id number] Arrival date & time: 06/06/23  1509     History {Add pertinent medical, surgical, social history, OB history to HPI:1} Chief Complaint  Patient presents with   Sore Throat   Cough    Erika Nguyen is a 89 y.o. female.  Patient has had a cough and sore throat for couple days.  Patient has a history of hypertension   Sore Throat  Cough      Home Medications Prior to Admission medications   Medication Sig Start Date End Date Taking? Authorizing Provider  acetaminophen (TYLENOL) 325 MG tablet Take 2 tablets (650 mg total) by mouth every 6 (six) hours as needed. 03/16/23   Sloan Leiter, DO  aspirin EC 81 MG tablet Take 81 mg by mouth daily. 07/10/15   [provider]  benazepril (LOTENSIN) 40 MG tablet Take 40 mg by mouth in the morning.    [provider]  Carbonyl Iron 15 MG CHEW Chew 15 mg by mouth daily. 06/11/22   [provider]  cholecalciferol (VITAMIN D3) 25 MCG (1000 UNIT) tablet Take 1,000 Units by mouth daily.    [provider]  Cyanocobalamin 2500 MCG TABS Take 2,500 mcg by mouth daily. 02/16/22   [provider]  estradiol (ESTRACE) 0.1 MG/GM vaginal cream Place 1 Applicatorful vaginally See admin instructions. Use on Tuesdays and Thursdays 06/12/21   [provider]  hydrALAZINE (APRESOLINE) 10 MG tablet Take 1 tablet by mouth 2 (two) times daily. 06/26/22   [provider]  levothyroxine (SYNTHROID) 100 MCG tablet Take 100 mcg by mouth daily before breakfast. 01/06/23 01/06/24  [provider]  pravastatin (PRAVACHOL) 40 MG tablet Take 1 tablet by mouth in the morning. 06/04/22   [provider]  sertraline (ZOLOFT) 25 MG tablet Take 1 tablet by mouth every evening. 02/20/22   [provider]      Allergies    Patient has no known allergies.    Review of Systems   Review of Systems   Respiratory:  Positive for cough.     Physical Exam Updated Vital Signs BP (!) 152/71   Pulse 64   Temp 98.2 F (36.8 C) (Oral)   Resp 16   Ht 4\' 11"  (1.499 m)   Wt 52 kg   SpO2 96%   BMI 23.15 kg/m  Physical Exam  ED Results / Procedures / Treatments   Labs (all labs ordered are listed, but only abnormal results are displayed) Labs Reviewed  RESP PANEL BY RT-PCR (RSV, FLU A&B, COVID)  RVPGX2 - Abnormal; Notable for the following components:      Result Value   SARS Coronavirus 2 by RT PCR POSITIVE (*)    All other components within normal limits  GROUP A STREP BY PCR    EKG None  Radiology DG Chest Port 1 View Result Date: 06/06/2023 CLINICAL DATA:  Cough.  Sore throat EXAM: PORTABLE CHEST 1 VIEW COMPARISON:  03/16/2023 FINDINGS: Heart size and mediastinal contours appear normal. Aortic atherosclerosis. Chronic coarsened interstitial markings appears similar to previous exam. No superimposed pleural effusion, interstitial edema or consolidation. No pneumothorax. Thoracolumbar scoliosis. No acute osseous findings. IMPRESSION: 1. No acute findings. Electronically Signed   By: Signa Kell M.D.   On: 06/06/2023 15:54    Procedures Procedures  {Document cardiac monitor, telemetry assessment procedure when appropriate:1}  Medications Ordered in ED Medications  nirmatrelvir/ritonavir (PAXLOVID) 3 tablet (has  no administration in time range)    ED Course/ Medical Decision Making/ A&P   {   Click here for ABCD2, HEART and other calculatorsREFRESH Note before signing :1}                              Medical Decision Making Amount and/or Complexity of Data Reviewed Radiology: ordered.  Risk Prescription drug management.   Patient with COVID-19.  She is put on Paxlovid and discharged home  {Document critical care time when appropriate:1} {Document review of labs and clinical decision tools ie heart score, Chads2Vasc2 etc:1}  {Document your independent review of  radiology images, and any outside records:1} {Document your discussion with family members, caretakers, and with consultants:1} {Document social determinants of health affecting pt's care:1} {Document your decision making why or why not admission, treatments were needed:1} Final Clinical Impression(s) / ED Diagnoses Final diagnoses:  COVID-19    Rx / DC Orders ED Discharge Orders     None

## 2023-06-08 ENCOUNTER — Ambulatory Visit: Payer: Medicare Other | Admitting: Podiatry

## 2023-06-22 ENCOUNTER — Ambulatory Visit (INDEPENDENT_AMBULATORY_CARE_PROVIDER_SITE_OTHER): Payer: Medicare Other | Admitting: Podiatry

## 2023-06-22 ENCOUNTER — Encounter: Payer: Self-pay | Admitting: Podiatry

## 2023-06-22 DIAGNOSIS — M79675 Pain in left toe(s): Secondary | ICD-10-CM

## 2023-06-22 DIAGNOSIS — B351 Tinea unguium: Secondary | ICD-10-CM

## 2023-06-22 DIAGNOSIS — M79674 Pain in right toe(s): Secondary | ICD-10-CM

## 2023-06-22 NOTE — Progress Notes (Signed)
  Subjective:  Patient ID: Erika Nguyen, female    DOB: 05/19/1926,  MRN: [id number]  Chief Complaint  Patient presents with   Nail Problem    Patient is on blood thinner , patient is here to get her toe nails cut    89 y.o. female presents with the above complaint. History confirmed with patient.  Here with her daughter.  They are unable to cut her toenails and she is not able to take care of them herself.  They are thickened elongated and causing pain and they have not been cutting quite a bit of time  Objective:  Physical Exam: warm, good capillary refill, no trophic changes or ulcerative lesions, normal DP and PT pulses, and normal sensory exam. Left Foot: dystrophic yellowed discolored nail plates with subungual debris Right Foot: dystrophic yellowed discolored nail plates with subungual debris   Assessment:   1. Pain due to onychomycosis of toenails of both feet      Plan:  Patient was evaluated and treated and all questions answered.  Discussed the etiology and treatment options for the condition in detail with the patient. Recommended debridement of the nails today. Sharp and mechanical debridement performed of all painful and mycotic nails today. Nails debrided in length and thickness using a nail nipper to level of comfort. Discussed treatment options including appropriate shoe gear.     Return in about 3 months (around 09/19/2023) for painful thick fungal nails.

## 2023-09-05 IMAGING — DX DG CHEST 2V
2 series · 2 of 2 positions shown · non-contrast
Comparison: September 06, 2017

CLINICAL DATA: Peripheral edema.

EXAM:
CHEST - 2 VIEW

[chest pa]
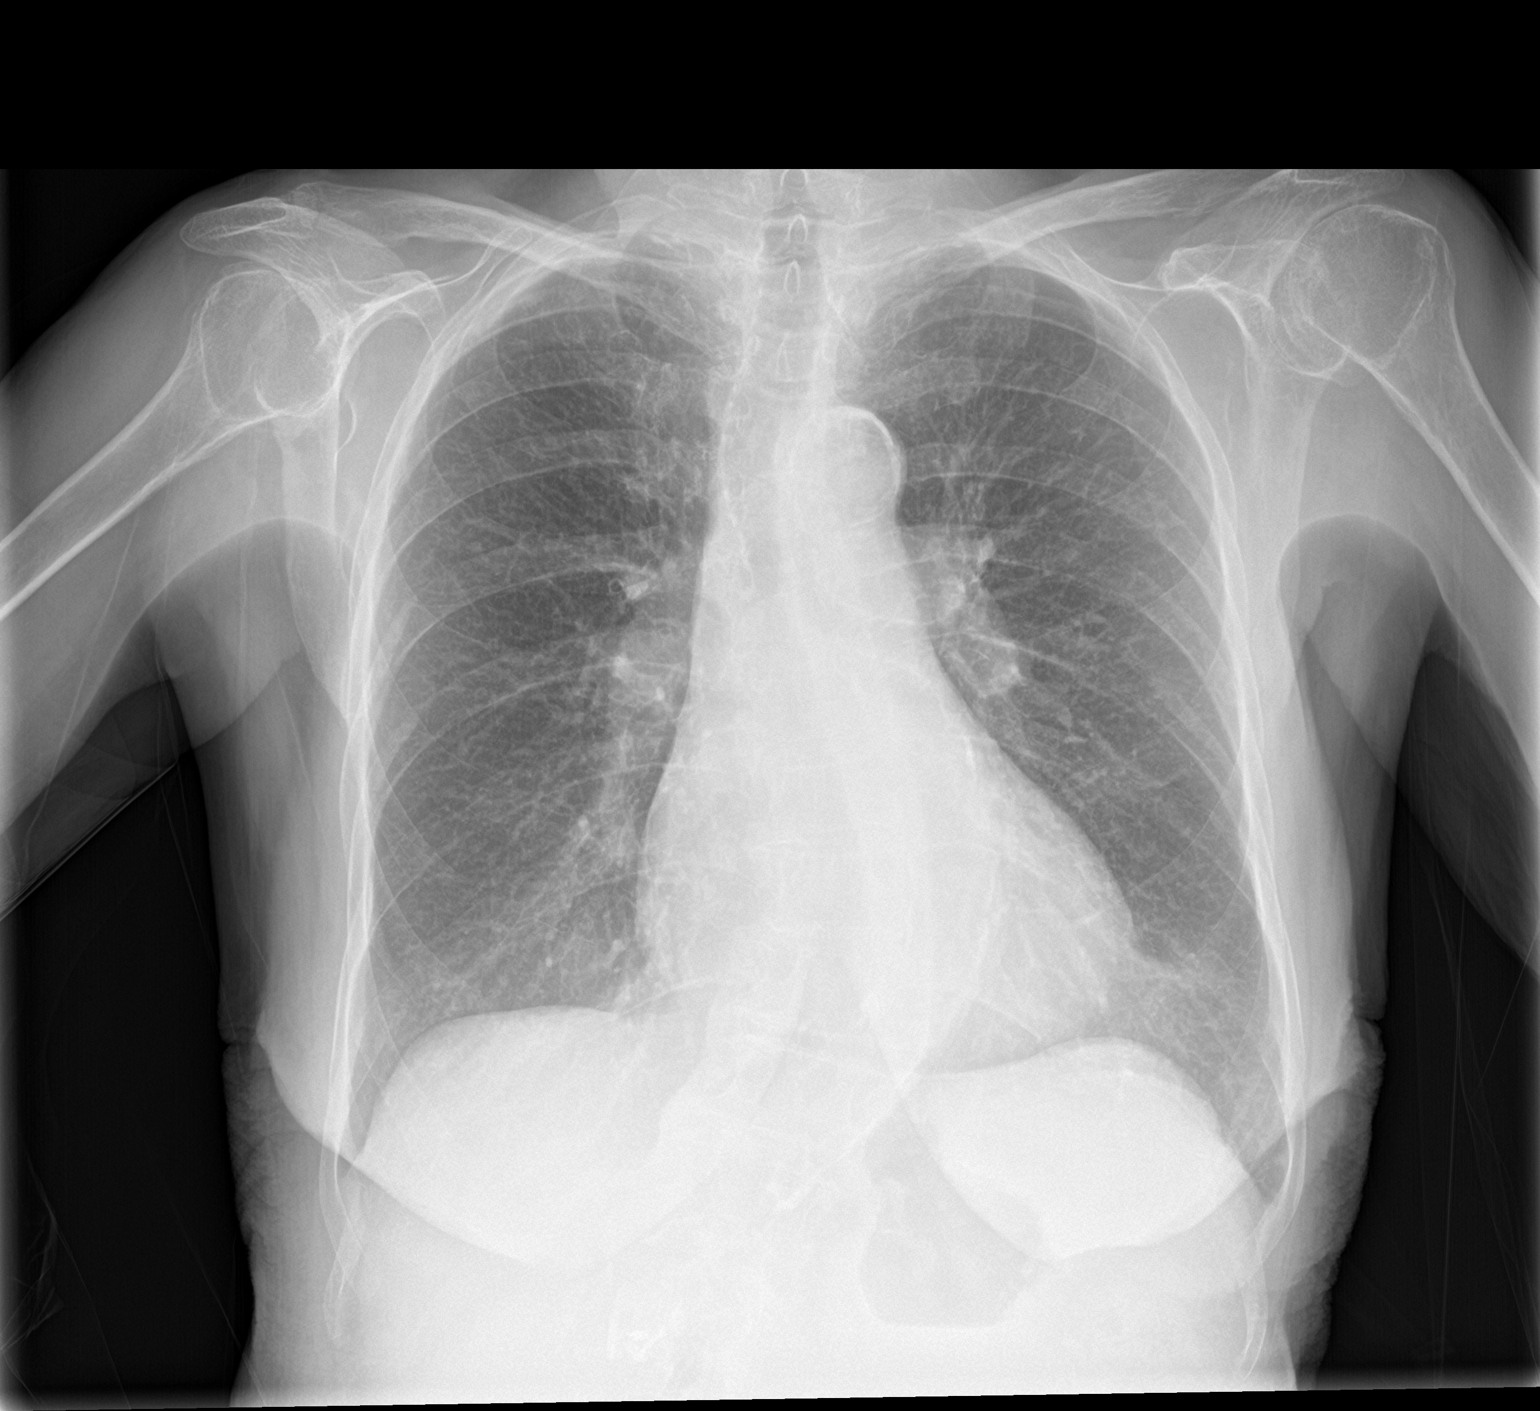

[chest lat]
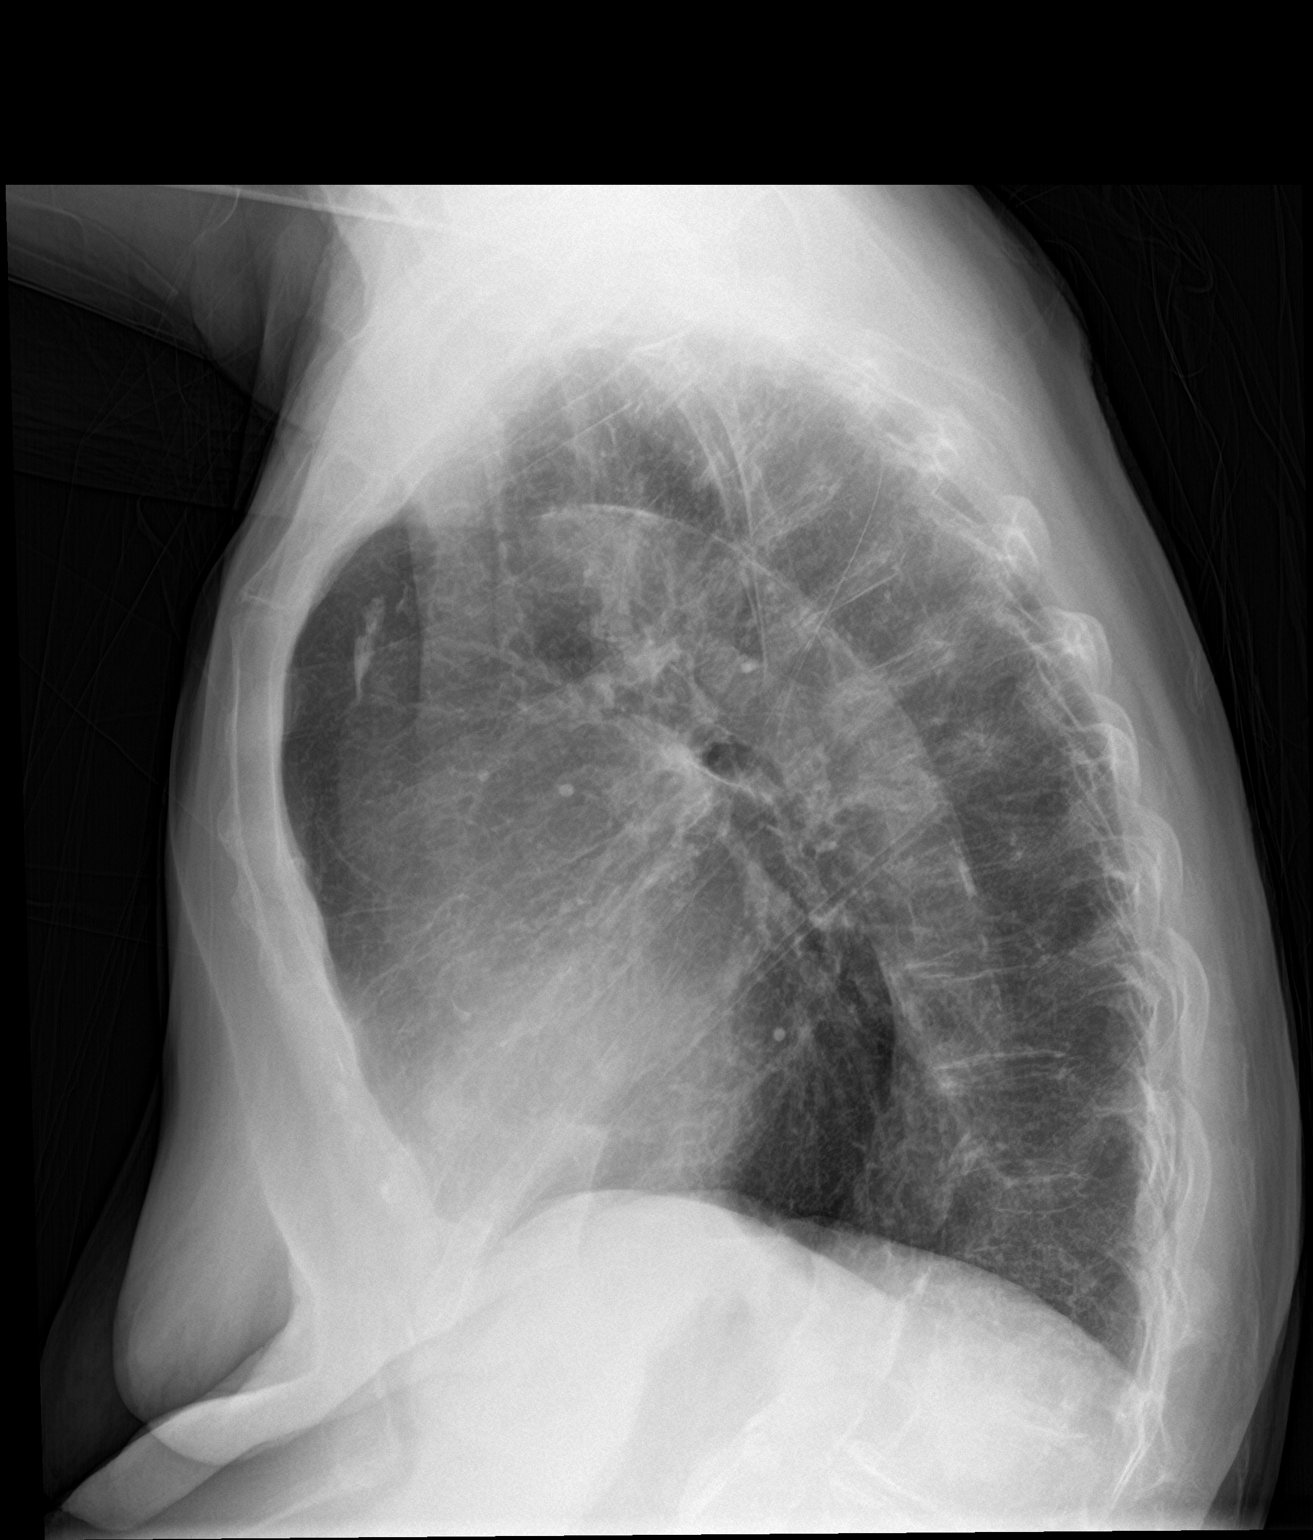

[2 of 2 positions shown; findings below may reference images not displayed]

FINDINGS: Chronic appearing diffusely increased interstitial lung markings are
seen, without evidence of acute infiltrate, pleural effusion or
pneumothorax. The heart size and mediastinal contours are within
normal limits. There is marked severity calcification and tortuosity
of the thoracic aorta. Degenerative changes seen throughout the
thoracic spine.
IMPRESSION: Chronic appearing increased interstitial lung markings without
evidence of acute or active cardiopulmonary disease.

## 2023-09-09 ENCOUNTER — Emergency Department (HOSPITAL_COMMUNITY)

## 2023-09-09 ENCOUNTER — Emergency Department (HOSPITAL_COMMUNITY)
Admission: EM | Admit: 2023-09-09 | Discharge: 2023-09-09 | Disposition: A | Discharge status: Home / Self Care | Attending: Emergency Medicine | Admitting: Emergency Medicine

## 2023-09-09 ENCOUNTER — Other Ambulatory Visit: Payer: Self-pay

## 2023-09-09 DIAGNOSIS — F0390 Unspecified dementia without behavioral disturbance: Secondary | ICD-10-CM | POA: Insufficient documentation

## 2023-09-09 DIAGNOSIS — W19XXXA Unspecified fall, initial encounter: Secondary | ICD-10-CM | POA: Diagnosis not present

## 2023-09-09 DIAGNOSIS — M7989 Other specified soft tissue disorders: Secondary | ICD-10-CM | POA: Diagnosis not present

## 2023-09-09 DIAGNOSIS — S0990XA Unspecified injury of head, initial encounter: Secondary | ICD-10-CM | POA: Insufficient documentation

## 2023-09-09 DIAGNOSIS — Z7982 Long term (current) use of aspirin: Secondary | ICD-10-CM | POA: Insufficient documentation

## 2023-09-09 LAB — BASIC METABOLIC PANEL WITH GFR
Anion gap: 6 (ref 5–15)
BUN: 20 mg/dL (ref 8–23)
CO2: 27 mmol/L (ref 22–32)
Calcium: 9.2 mg/dL (ref 8.9–10.3)
Chloride: 103 mmol/L (ref 98–111)
Creatinine, Ser: 0.73 mg/dL (ref 0.44–1.00)
GFR, Estimated: >60 mL/min (ref >60)
Glucose, Bld: 93 mg/dL (ref 70–99)
Potassium: 4.3 mmol/L (ref 3.5–5.1)
Sodium: 136 mmol/L (ref 135–145)

## 2023-09-09 NOTE — Discharge Instructions (Addendum)
 We saw you in the ER after you had a fall. All the imaging results are normal, no fractures seen. No evidence of brain bleed. All electrolytes are reassuring. Kidney function are reassuring.  Please be very careful with walking, and do everything possible to prevent falls.

## 2023-09-09 NOTE — ED Triage Notes (Signed)
 Pt BIB EMS from [facility] at [place] due to unwitnessed fall. No LOC or blood thinners according to pt. Pt found on ground by facility unclear how long pt was on ground. Pt report no pain and no medical complaints at this time. Pt reports hitting her head and left arm on ground but pain went away. Baseline Dementia; pt wears hearing aids. Pt AAOx3 not time. Pt walks with walker. Pt report her legs got weak and she just fell.

## 2023-09-09 NOTE — ED Provider Notes (Signed)
 [place] EMERGENCY DEPARTMENT AT [facility] Provider Note   CSN: [id number] Arrival date & time: 09/09/23  6578     History  Chief Complaint  Patient presents with   Fall    Erika Nguyen is a 89 y.o. female.  HPI    89 year old female comes in with chief complaint of mechanical fall.   Home Medications Prior to Admission medications   Medication Sig Start Date End Date Taking? Authorizing Provider  acetaminophen  (TYLENOL ) 325 MG tablet Take 2 tablets (650 mg total) by mouth every 6 (six) hours as needed. 03/16/23   Teddi Favors, DO  aspirin  EC 81 MG tablet Take 81 mg by mouth daily. 07/10/15   [provider]  benazepril  (LOTENSIN ) 40 MG tablet Take 40 mg by mouth in the morning.    [provider]  Carbonyl Iron  15 MG CHEW Chew 15 mg by mouth daily. 06/11/22   [provider]  cholecalciferol (VITAMIN D3) 25 MCG (1000 UNIT) tablet Take 1,000 Units by mouth daily.    [provider]  Cyanocobalamin  2500 MCG TABS Take 2,500 mcg by mouth daily. 02/16/22   [provider]  estradiol (ESTRACE) 0.1 MG/GM vaginal cream Place 1 Applicatorful vaginally See admin instructions. Use on Tuesdays and Thursdays 06/12/21   [provider]  hydrALAZINE  (APRESOLINE ) 10 MG tablet Take 1 tablet by mouth 2 (two) times daily. 06/26/22   [provider]  levothyroxine  (SYNTHROID ) 100 MCG tablet Take 100 mcg by mouth daily before breakfast. 01/06/23 01/06/24  [provider]  pravastatin  (PRAVACHOL ) 40 MG tablet Take 1 tablet by mouth in the morning. 06/04/22   [provider]  sertraline  (ZOLOFT ) 25 MG tablet Take 1 tablet by mouth every evening. 02/20/22   [provider]      Allergies    Patient has no known allergies.    Review of Systems   Review of Systems  All other systems reviewed and are negative.   Physical Exam Updated Vital Signs BP (!) 127/55   Pulse 66   Temp 97.8 F (36.6 C)  (Oral)   SpO2 96%  Physical Exam Vitals and nursing note reviewed.  Constitutional:      Appearance: She is well-developed.  HENT:     Head: Atraumatic.  Eyes:     Extraocular Movements: Extraocular movements intact.  Cardiovascular:     Rate and Rhythm: Normal rate.  Pulmonary:     Effort: Pulmonary effort is normal.  Musculoskeletal:        General: Tenderness present.     Cervical back: Normal range of motion and neck supple.     Comments: L humeral tenderness  Head to toe evaluation shows no hematoma, bleeding of the scalp, no facial abrasions, no spine step offs, crepitus of the chest or neck, no tenderness to palpation of the bilateral upper and lower extremities, no gross deformities, no chest tenderness, no pelvic pain.   Skin:    General: Skin is dry.  Neurological:     Mental Status: She is alert and oriented to person, place, and time.     ED Results / Procedures / Treatments   Labs (all labs ordered are listed, but only abnormal results are displayed) Labs Reviewed  BASIC METABOLIC PANEL WITH GFR    EKG None  Radiology DG Humerus Left Result Date: 09/09/2023 CLINICAL DATA:  Status post fall EXAM: LEFT HUMERUS - 2+ VIEW COMPARISON:  None Available. FINDINGS: Osteoarthrosis glenohumeral joint and greater  tuberosity humeral head No fractures. IMPRESSION: No fractures Electronically Signed   By: Fredrich Jefferson M.D.   On: 09/09/2023 10:25   CT Head Wo Contrast Result Date: 09/09/2023 CLINICAL DATA:  Minor head trauma EXAM: CT HEAD WITHOUT CONTRAST TECHNIQUE: Contiguous axial images were obtained from the base of the skull through the vertex without intravenous contrast. RADIATION DOSE REDUCTION: This exam was performed according to the departmental dose-optimization program which includes automated exposure control, adjustment of the mA and/or kV according to patient size and/or use of iterative reconstruction technique. COMPARISON:  03/16/2023 brain MRI FINDINGS:  Brain: No evidence of acute infarction, hemorrhage, hydrocephalus, extra-axial collection or or mass effect. Dural based mass lateral to the right cerebellum measuring 16 mm in length, meningioma which is stable from comparison and causes no brain mass effect. Cerebral volume loss and chronic small vessel ischemia that is mild for age with no interval progression detected. Vascular: No hyperdense vessel or unexpected calcification. Skull: Negative for fracture Sinuses/Orbits: No evidence of injury IMPRESSION: No evidence of intracranial injury. Aging brain and known right posterior fossa meningioma without change from 2024 MRI. Electronically Signed   By: Ronnette Coke M.D.   On: 09/09/2023 10:19    Procedures Procedures    Medications Ordered in ED Medications - No data to display  ED Course/ Medical Decision Making/ A&P                                 Medical Decision Making Amount and/or Complexity of Data Reviewed Labs: ordered. Radiology: ordered.   89 year old patient comes in after sustaining what appears to be a mechanical fall. Pertinent past medical includes dementia, no anticoagulation use. Collateral history provided by patient's daughter, nursing home.  Daughter is at the bedside.  Based on my history and exam, differential diagnosis includes: - Traumatic brain injury including intracranial hemorrhage - Long bone fractures - Contusions - Soft tissue injury - Concussion  Based on the initial assessment, the following workup was initiated plan is to get x-ray of the humerus, CT scan of the brain and metabolic profile.  The lab is at the request of patient's daughter/granddaughter, who is a Publishing rights manager and wants electrolytes checked.  Patient has no GI symptoms.  I have independently interpreted the following imaging from the perspective of acute trauma: CT scan of the brain, x-ray of the humerus and the results indicate no evidence of brain bleed or  fracture.     Final Clinical Impression(s) / ED Diagnoses Final diagnoses:  Fall, initial encounter    Rx / DC Orders ED Discharge Orders     None         Deatra Face, MD 09/09/23 1230

## 2023-10-06 ENCOUNTER — Ambulatory Visit: Payer: Medicare Other | Admitting: Podiatry

## 2023-12-28 ENCOUNTER — Encounter: Payer: Self-pay | Admitting: Podiatry

## 2023-12-28 ENCOUNTER — Ambulatory Visit (INDEPENDENT_AMBULATORY_CARE_PROVIDER_SITE_OTHER): Admitting: Podiatry

## 2023-12-28 DIAGNOSIS — M79674 Pain in right toe(s): Secondary | ICD-10-CM

## 2023-12-28 DIAGNOSIS — M79675 Pain in left toe(s): Secondary | ICD-10-CM

## 2023-12-28 DIAGNOSIS — B351 Tinea unguium: Secondary | ICD-10-CM

## 2023-12-28 NOTE — Progress Notes (Signed)
 This patient presents to the office with chief complaint of long thick painful nails.  Patient says the nails are painful walking and wearing shoes.  This patient is unable to self treat.  This patient is unable to trim her nails since she is unable to reach her nails. She presents to the office with her daughter in a wheelchair. She presents to the office for preventative foot care services.  General Appearance  Alert, conversant and in no acute stress.  Vascular  Dorsalis pedis and posterior tibial  pulses are  weakly palpable  bilaterally.  Capillary return is within normal limits  bilaterally. Temperature is within normal limits  bilaterally.  Neurologic  Senn-Weinstein monofilament wire test within normal limits  bilaterally. Muscle power within normal limits bilaterally.  Nails Thick disfigured discolored nails with subungual debris  from hallux to fifth toes bilaterally. No evidence of bacterial infection or drainage bilaterally.  Orthopedic  No limitations of motion  feet .  No crepitus or effusions noted.  No bony pathology or digital deformities noted.  Skin  normotropic skin with no porokeratosis noted bilaterally.  No signs of infections or ulcers noted.     Onychomycosis  Nails  B/L.  Pain in right toes  Pain in left toes  Debridement of nails both feet followed trimming the nails with dremel tool.    RTC 4  months.   Cordella Bold DPM

## 2024-05-15 ENCOUNTER — Ambulatory Visit: Admitting: Podiatry

## 2024-05-15 ENCOUNTER — Encounter: Payer: Self-pay | Admitting: Podiatry

## 2024-05-15 VITALS — Ht 59.0 in | Wt 114.0 lb

## 2024-05-15 DIAGNOSIS — M79675 Pain in left toe(s): Secondary | ICD-10-CM

## 2024-05-15 DIAGNOSIS — B351 Tinea unguium: Secondary | ICD-10-CM | POA: Diagnosis not present

## 2024-05-15 DIAGNOSIS — M79674 Pain in right toe(s): Secondary | ICD-10-CM

## 2024-05-15 NOTE — Progress Notes (Signed)
 This patient presents to the office with chief complaint of long thick painful nails.  Patient says the nails are painful walking and wearing shoes.  This patient is unable to self treat.  This patient is unable to trim her nails since she is unable to reach her nails. She presents to the office with her daughter in a wheelchair. She presents to the office for preventative foot care services.  General Appearance  Alert, conversant and in no acute stress.  Vascular  Dorsalis pedis and posterior tibial  pulses are  weakly palpable  bilaterally.  Capillary return is within normal limits  bilaterally. Temperature is within normal limits  bilaterally.  Neurologic  Senn-Weinstein monofilament wire test within normal limits  bilaterally. Muscle power within normal limits bilaterally.  Nails Thick disfigured discolored nails with subungual debris  from hallux to fifth toes bilaterally. No evidence of bacterial infection or drainage bilaterally.  Orthopedic  No limitations of motion  feet .  No crepitus or effusions noted.  No bony pathology or digital deformities noted.  Skin  normotropic skin with no porokeratosis noted bilaterally.  No signs of infections or ulcers noted.     Onychomycosis  Nails  B/L.  Pain in right toes  Pain in left toes  Debridement of nails both feet followed trimming the nails with dremel tool.    RTC 4  months.   Cordella Bold DPM

## 2024-08-23 ENCOUNTER — Ambulatory Visit: Admitting: Podiatry
